# Patient Record
Sex: Female | Born: 1945 | Race: White | Hispanic: No | Marital: Married | Smoking: Former smoker
Health system: Southern US, Academic
[De-identification: ages and names within clinical notes are randomized; demographics above are authoritative.]

---

## 2003-11-24 HISTORY — PX: REPLACEMENT TOTAL KNEE: SUR1224

## 2010-12-24 HISTORY — PX: COLONOSCOPY W/ ENDOSCOPIC FNA/FNB: SHX1378

## 2012-07-20 ENCOUNTER — Ambulatory Visit (HOSPITAL_COMMUNITY): Payer: Self-pay | Admitting: Family Medicine

## 2014-09-13 IMAGING — DX SACRUM/COCCYX
3 series · 3 of 3 positions shown · non-contrast
Comparison: None

SACRUM/COCCYX 09/13/2014 [DATE]:
CLINICAL INDICATION: Coccyx pain x2 months

[AP]
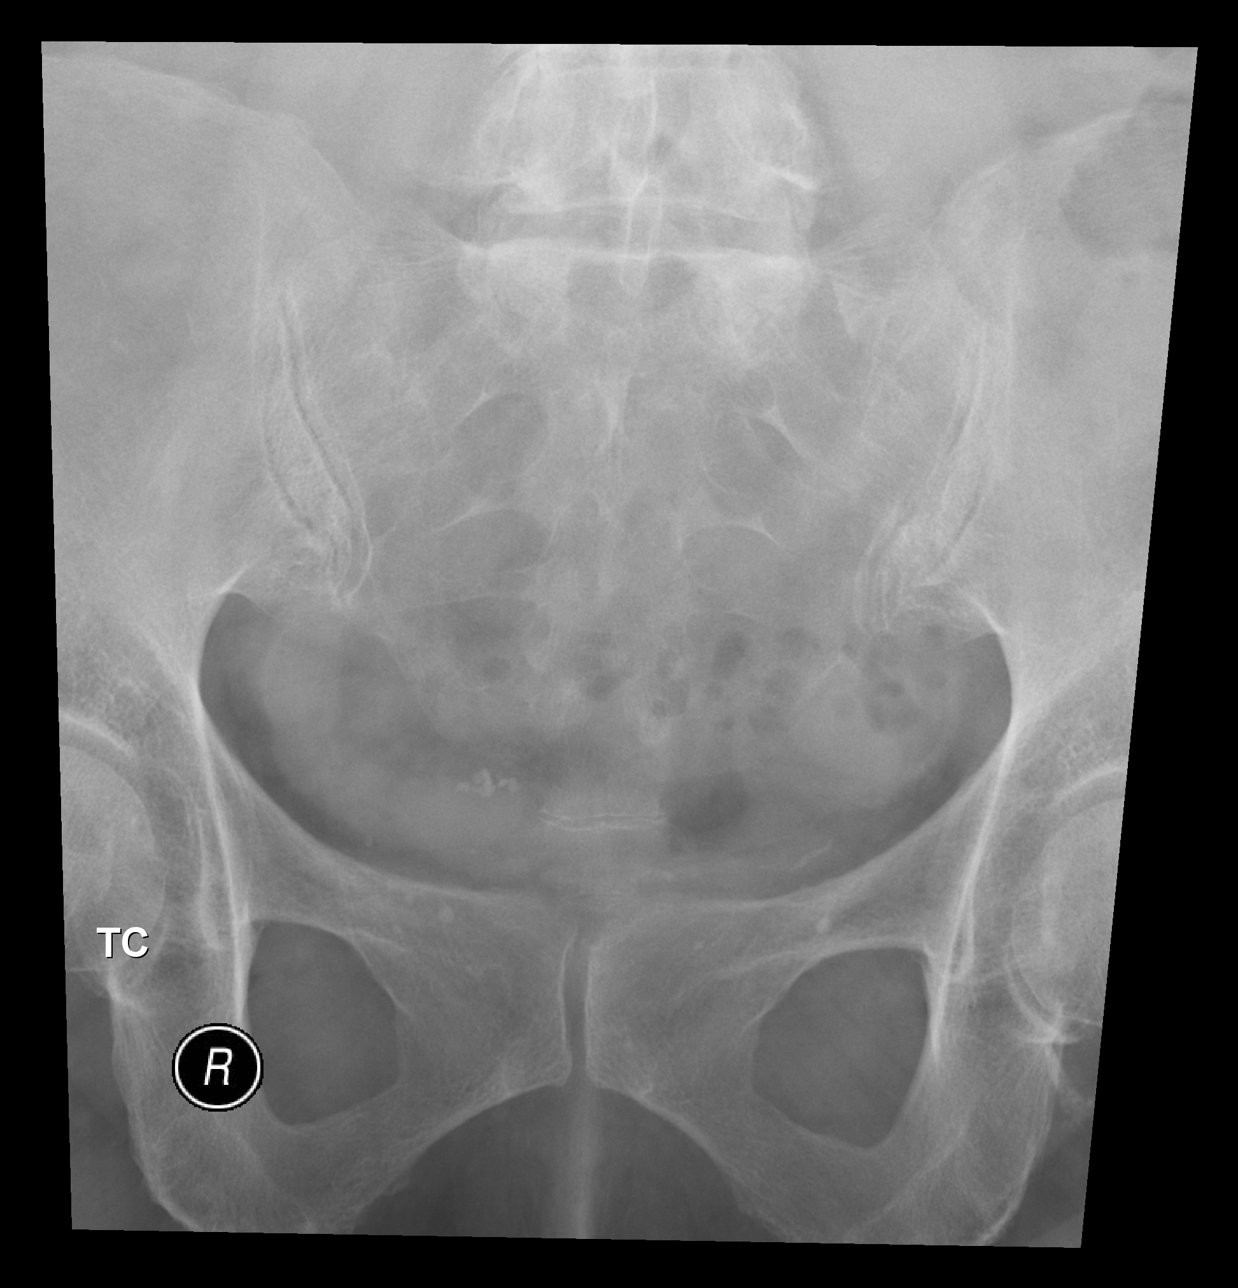

[lateral]
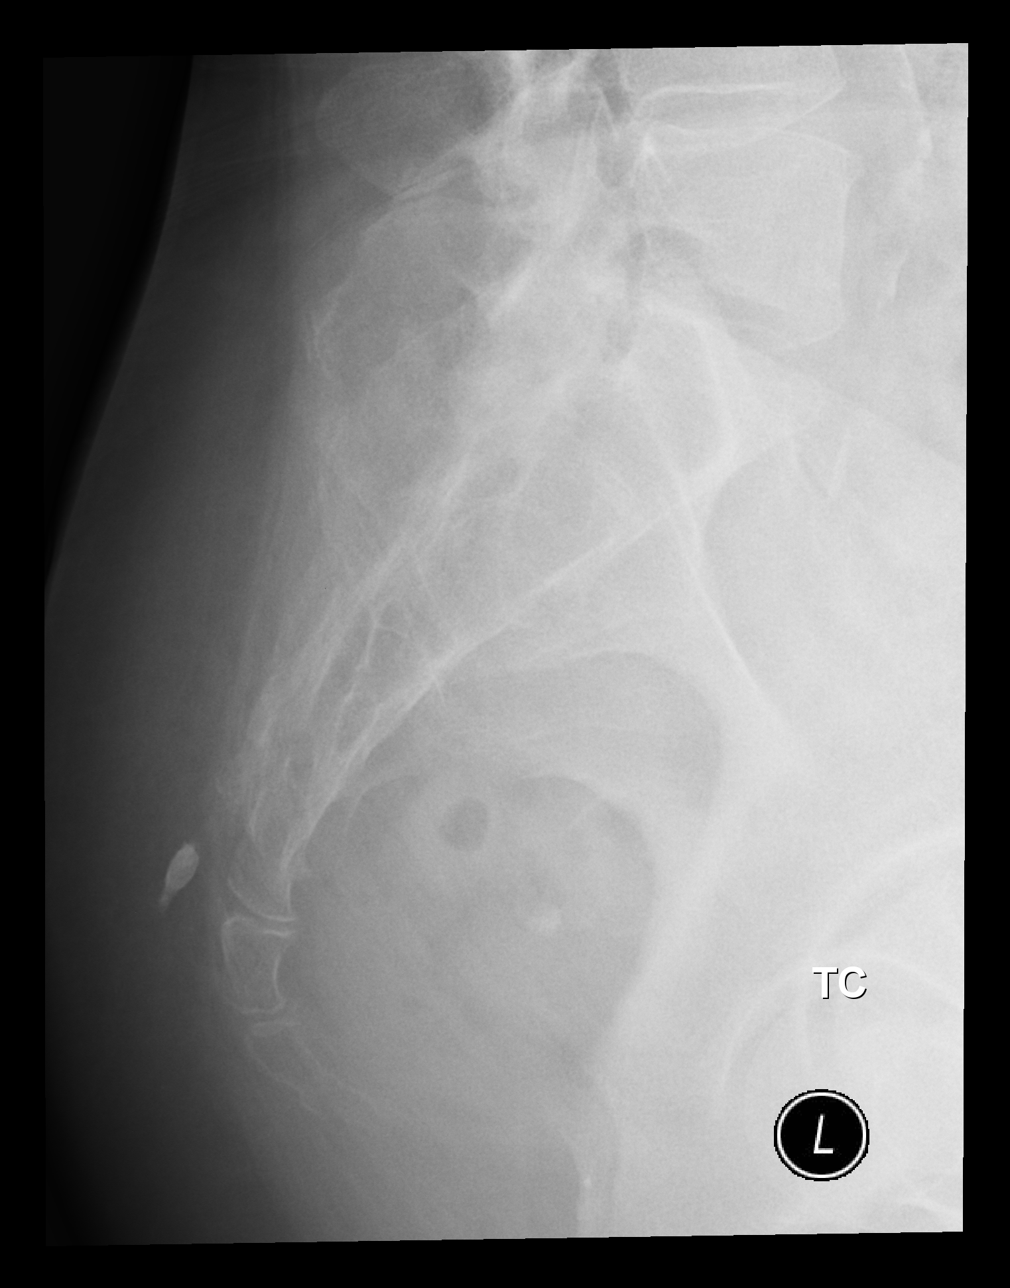

[ap axial]
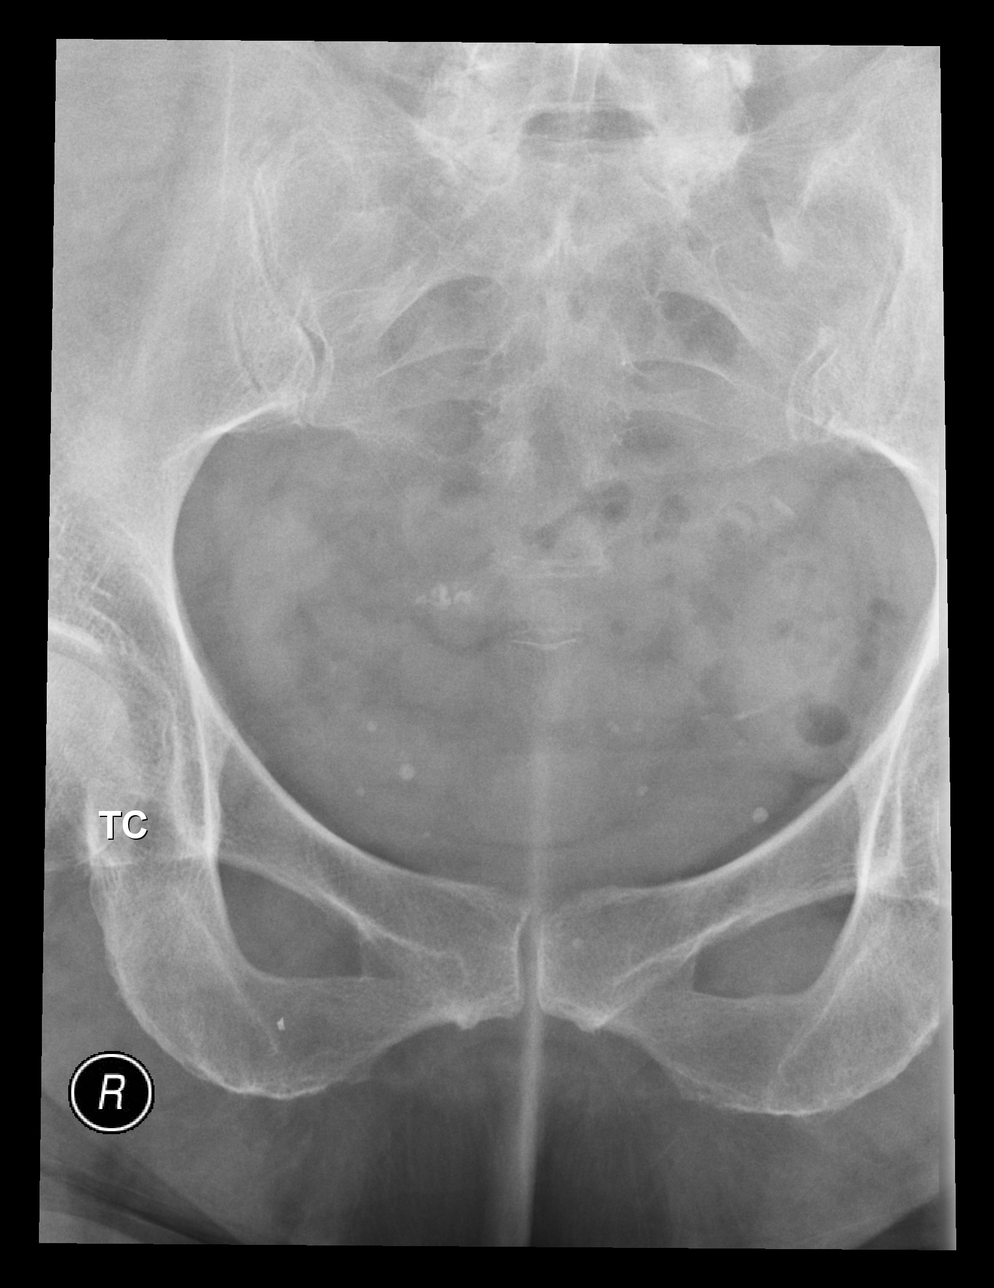

[3 of 3 positions shown; findings below may reference images not displayed]

FINDINGS: There were mild to moderate SI joint degenerative changes. There is
no
bone erosion. Sacrococcygeal alignment appears maintained on the lateral view.
There is no evidence for sacral fracture. There are pelvic phleboliths. There
is
osteoporosis.
IMPRESSION: SI joint degenerative changes. No evidence for sacral or coccyx fracture or
bone
destruction. If symptoms persist MRI would be useful for further evaluation.

## 2015-02-11 ENCOUNTER — Ambulatory Visit (INDEPENDENT_AMBULATORY_CARE_PROVIDER_SITE_OTHER): Payer: Self-pay | Admitting: Gastroenterology

## 2015-06-07 IMAGING — MG MAMMOGRAPHY SCREENING BILATERAL 3D TOMOSYNTHESIS WITH CAD
12 of 16 series · 12 of 32 positions shown · non-contrast
Comparison: 05/01/2014 through 12/28/2008.
BREAST DENSITY: (Level B) There are scattered areas of fibroglandular density.

MAMMOGRAPHY SCREENING 3D TOMOSYNTHESIS WITH CAD, 06/07/2015 [DATE]:
CLINICAL INDICATION: Screening.
TECHNIQUE: Digital mammograms and 3-D Tomosynthesis were obtained. These were
interpreted both primarily and with the aid of computer-aided detection
system.

[L CC synth-2D]
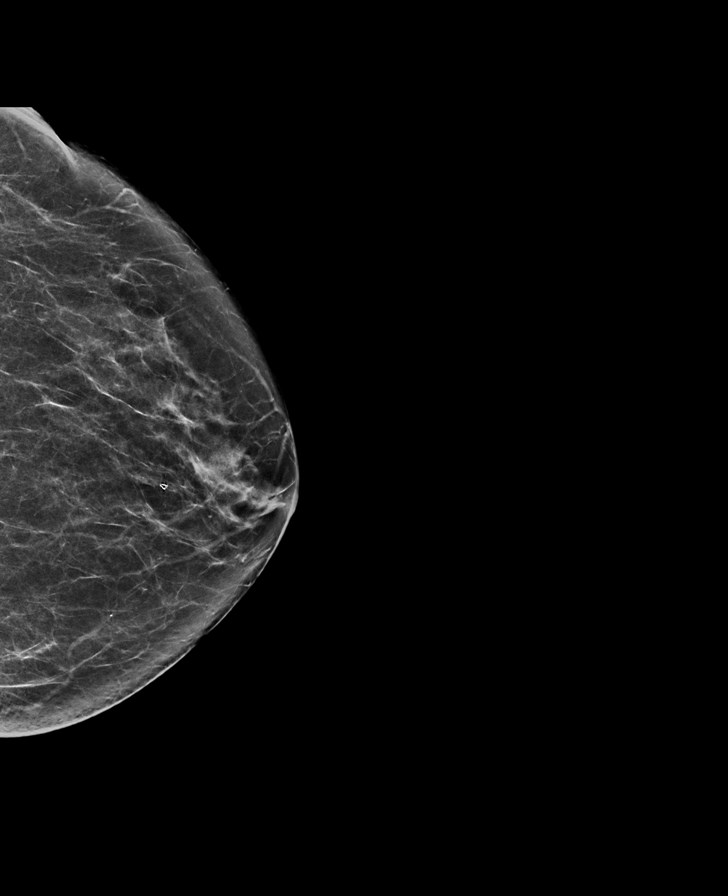

[R CC synth-2D]
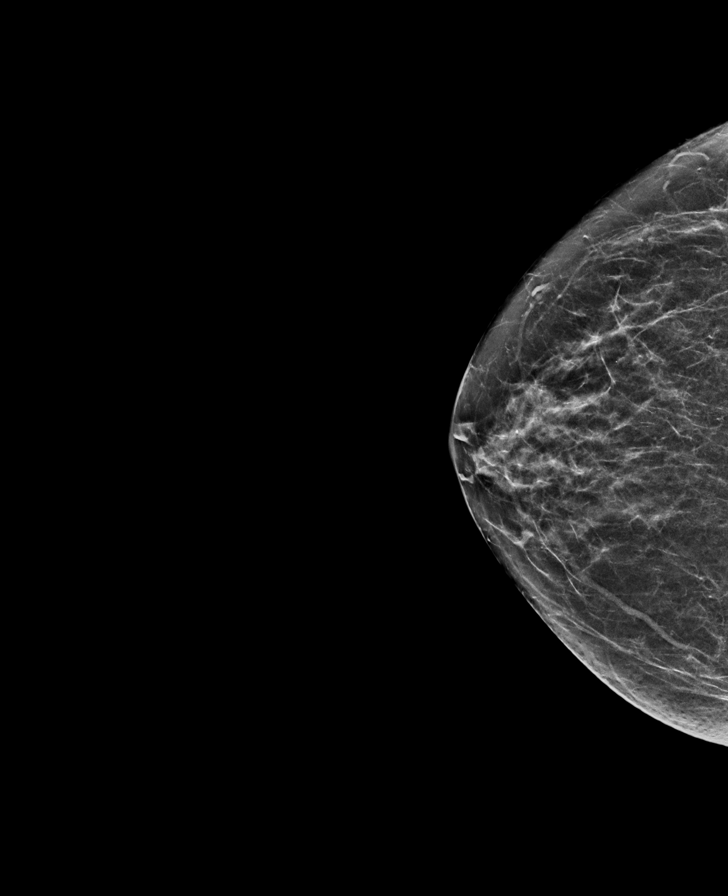

[R MLO synth-2D]
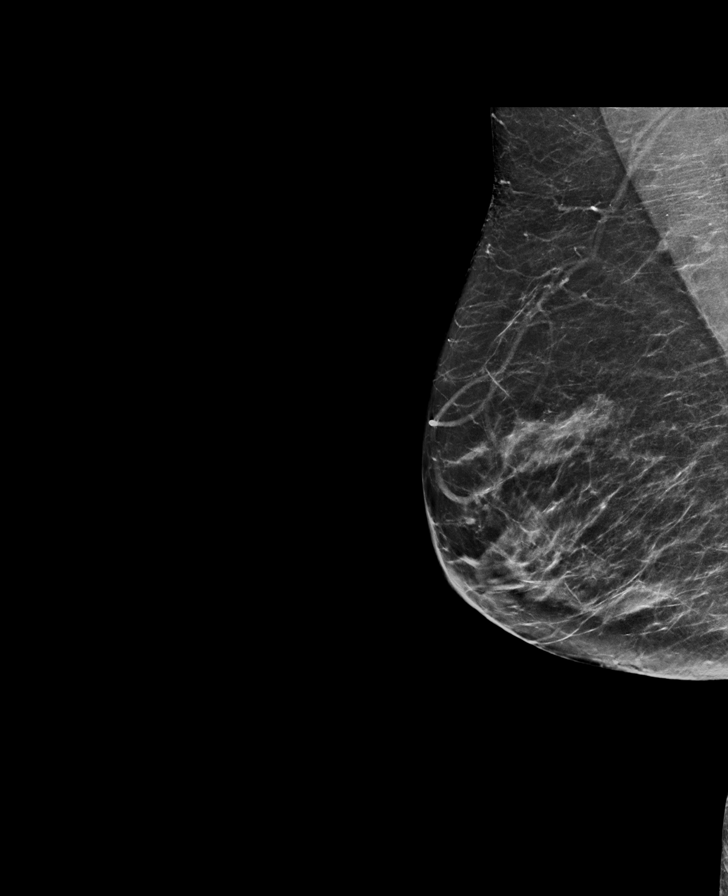

[L MLO synth-2D]
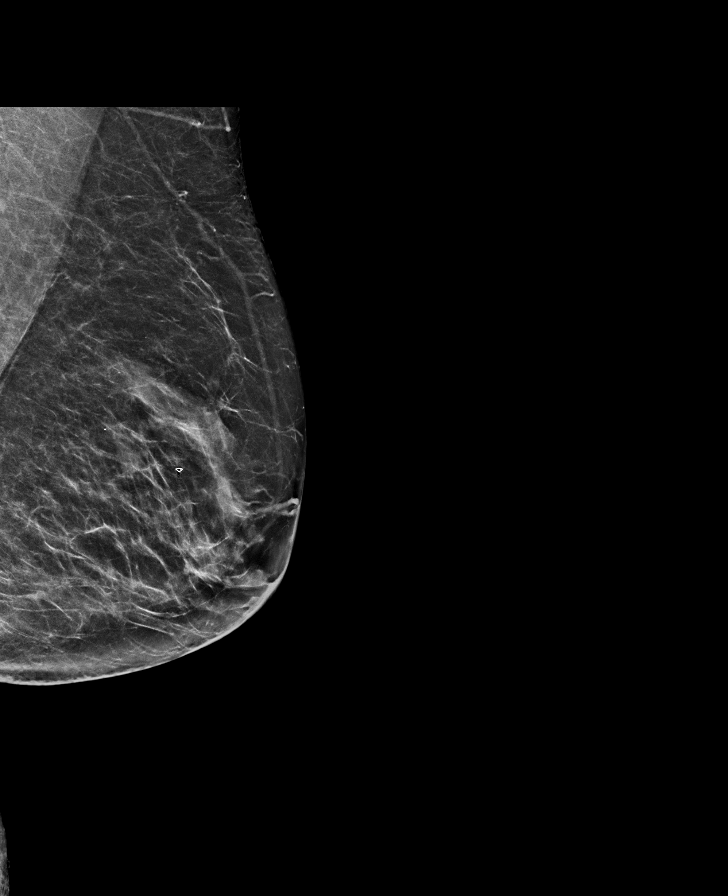

[L CC]
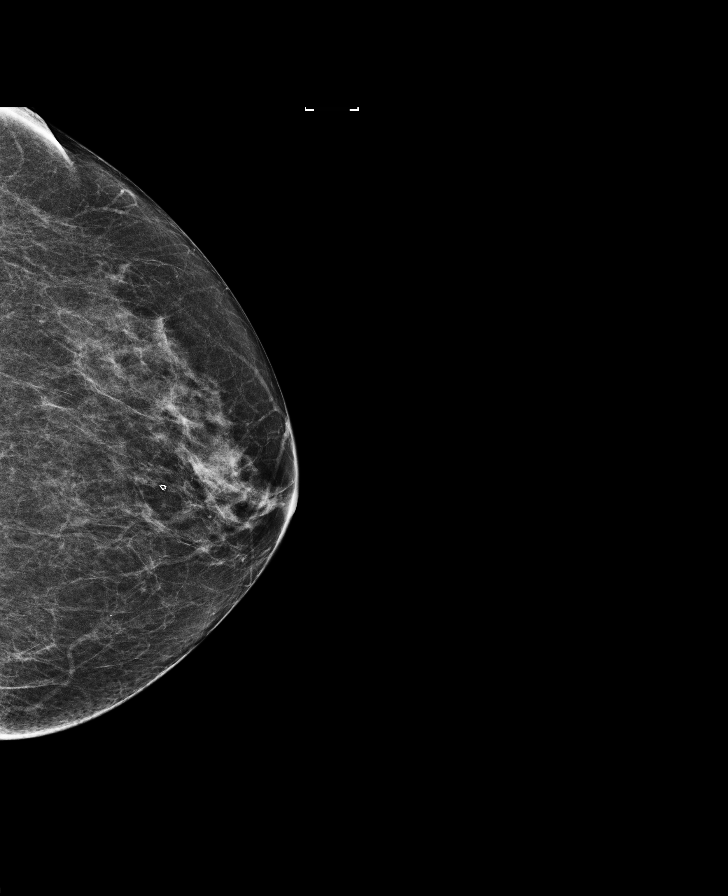

[R CC]
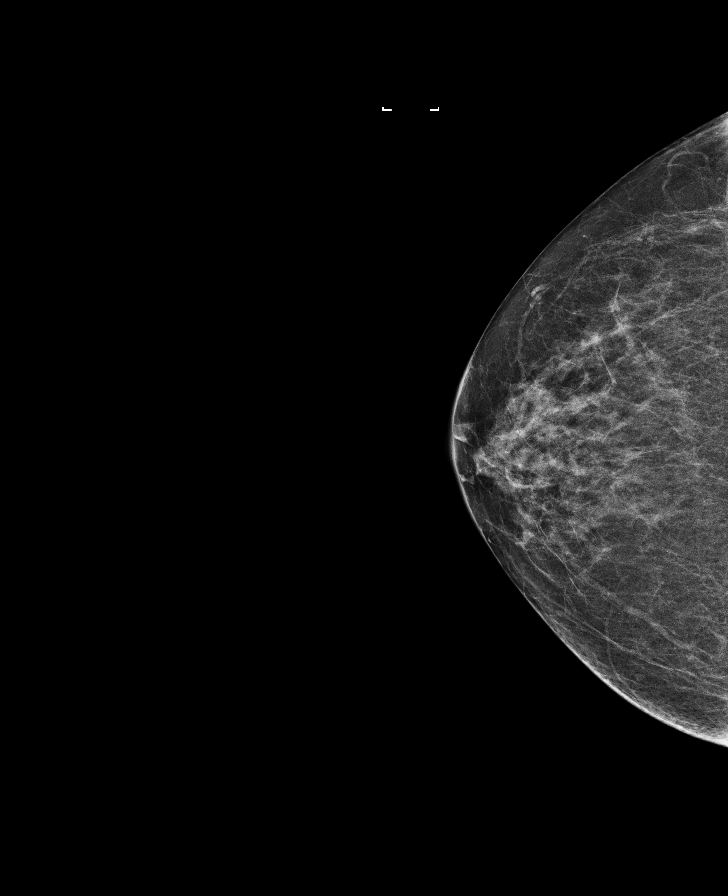

[L MLO]
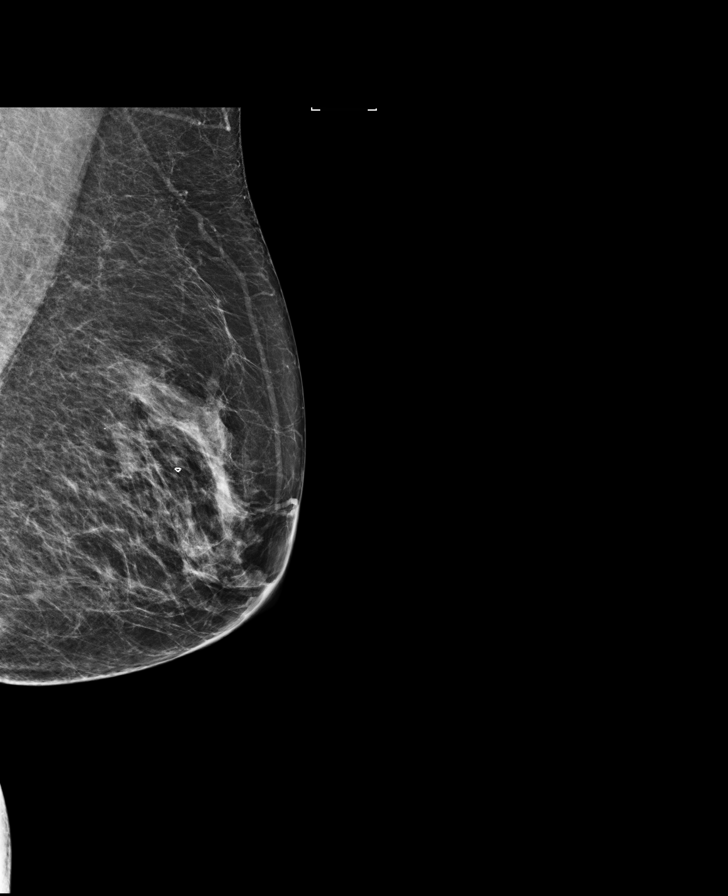

[R MLO]
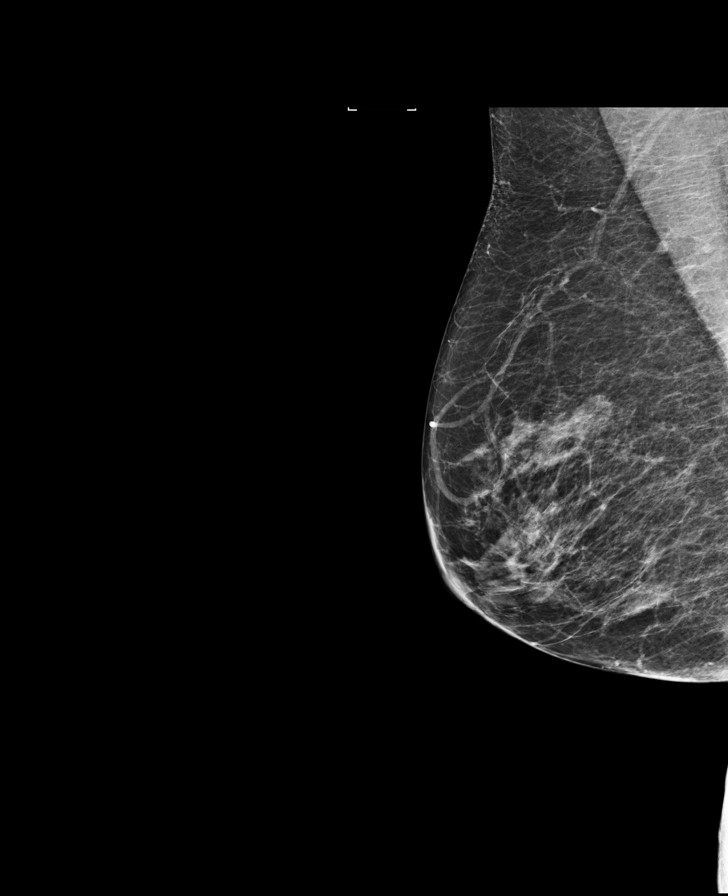

[L CC tomo]
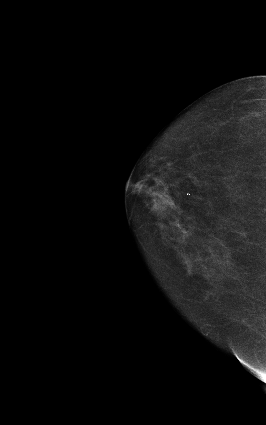

[R CC tomo]
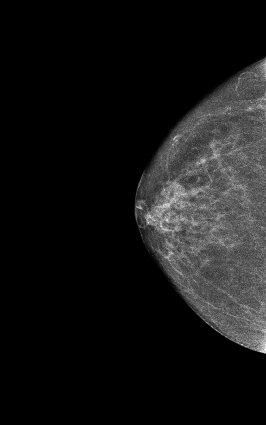

[R MLO tomo]
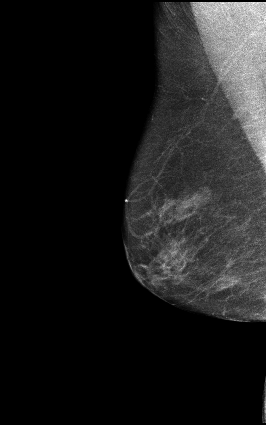

[L MLO tomo]
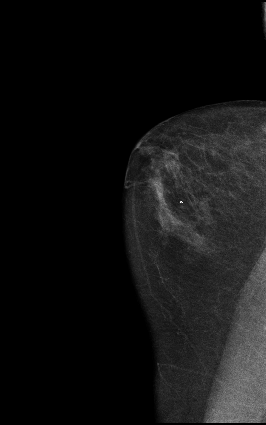

[12 of 32 positions shown; findings below may reference images not displayed]

FINDINGS: No mammographically suspicious abnormality and no significant
change.
IMPRESSION: ( BI-RADS 2) Benign findings. Routine mammographic follow-up is recommended.

## 2015-06-07 IMAGING — CT CT ABDOMEN AND PELVIS WITHOUT CONTRAST
2 of 3 series · 16 of 46 positions shown, 18 images · non-contrast
Comparison: Comparison was made to the prior exam(s) within the last 12
months
dated  06/07/2015 ultrasound exam.

CT ABDOMEN AND PELVIS WITHOUT CONTRAST, 06/07/2015 [DATE]:
CLINICAL INDICATION:  Renal colic. UTI. History of kidney stones. Ended
antibiotics yesterday.
A search for DICOM formatted images was conducted for prior CT imaging studies
completed at a non-affiliated media free facility.
TECHNIQUE: The region of interest was scanned without contrast on a high
resolution CT scanner using dose reduction techniques .  Routine MPR
reconstructions were performed.

[Series 3: abd/pel w/o 3.0 i41s 2 · axial · non-contrast · 0.96mm/px · z∈[-512,-83]mm · 13 of 165 slices shown, 15 images]
[im 11/165  soft-tissue]
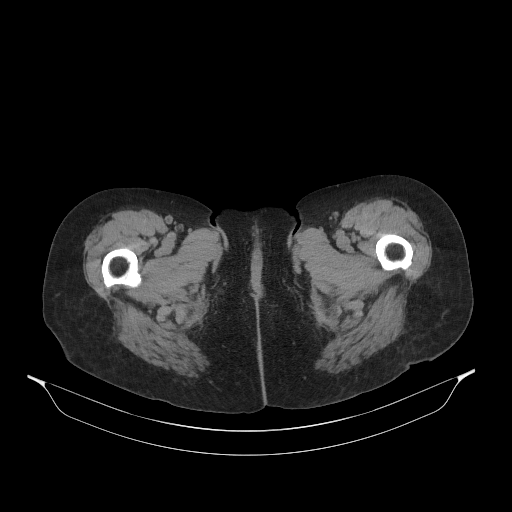
[im 11/165  bone]
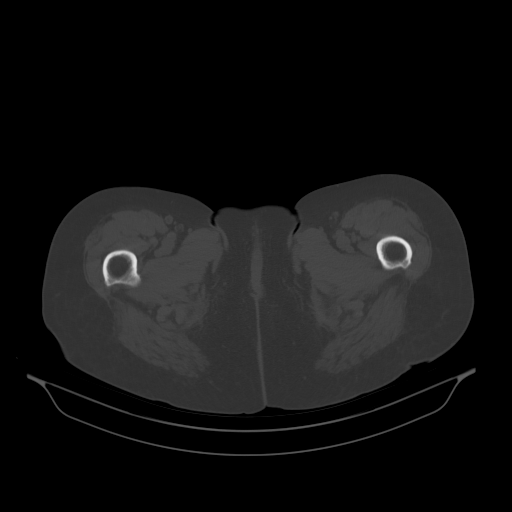
[im 22/165  soft-tissue]
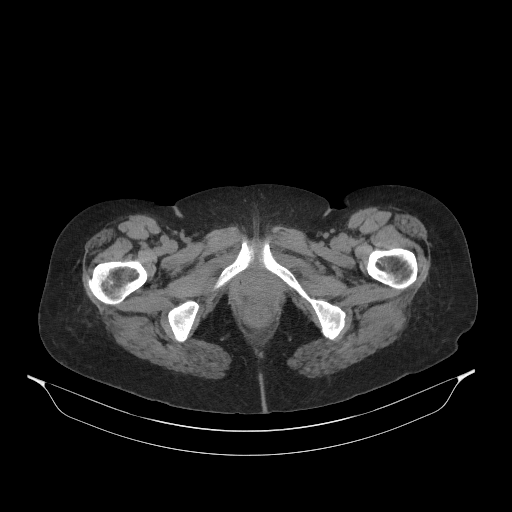
[im 32/165  soft-tissue]
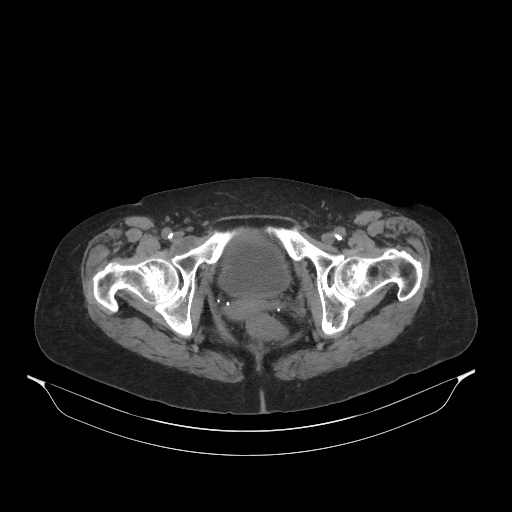
[im 48/165  soft-tissue]
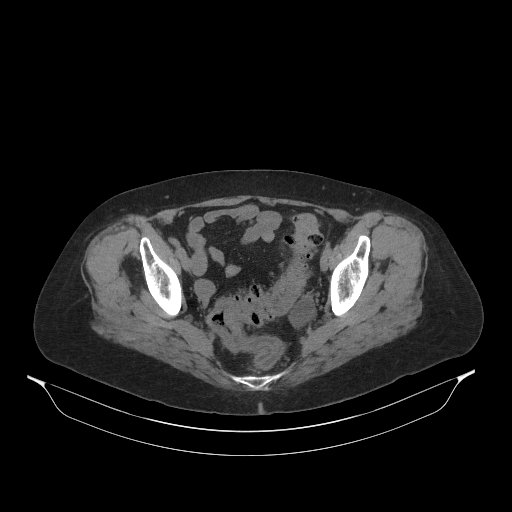
[im 59/165  soft-tissue]
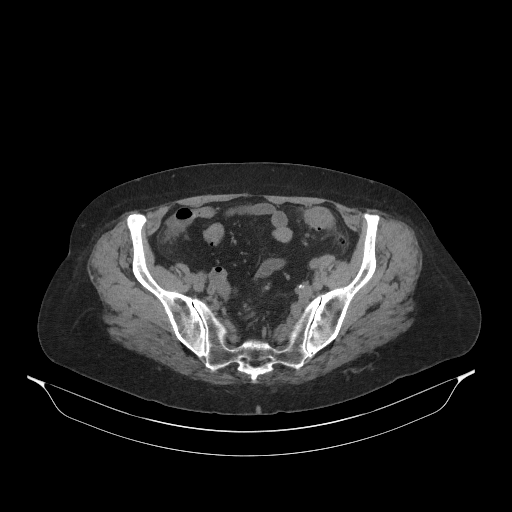
[im 69/165  soft-tissue]
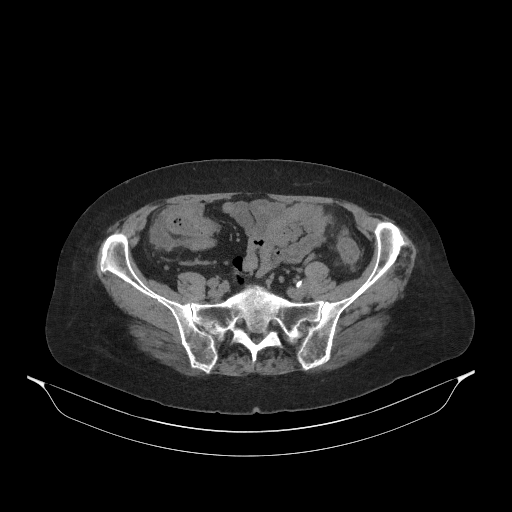
[im 85/165  soft-tissue]
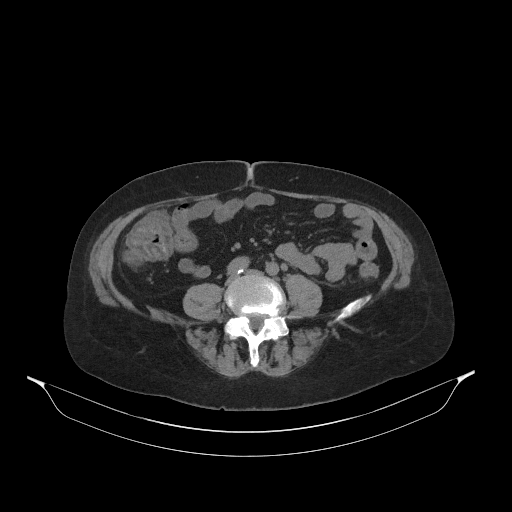
[im 96/165  soft-tissue]
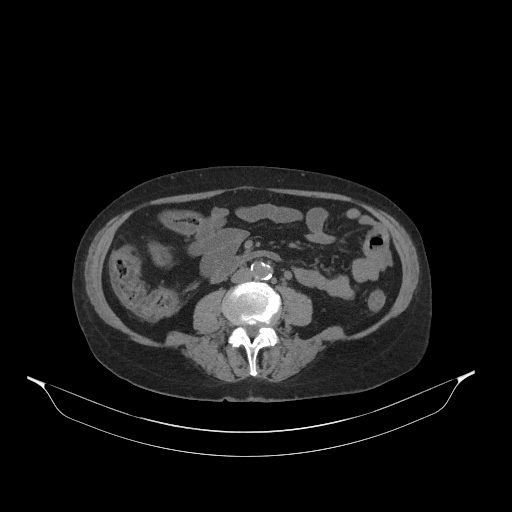
[im 106/165  soft-tissue]
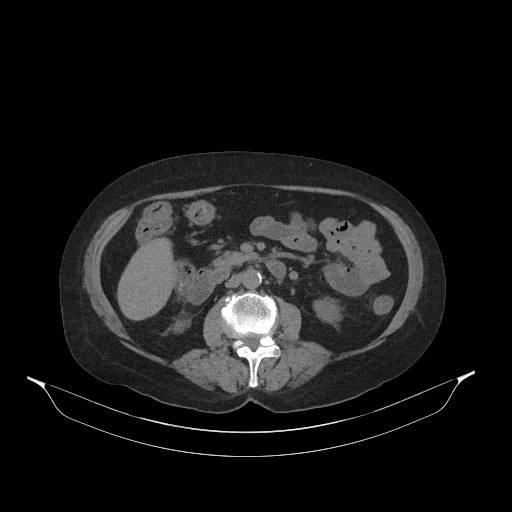
[im 106/165  bone]
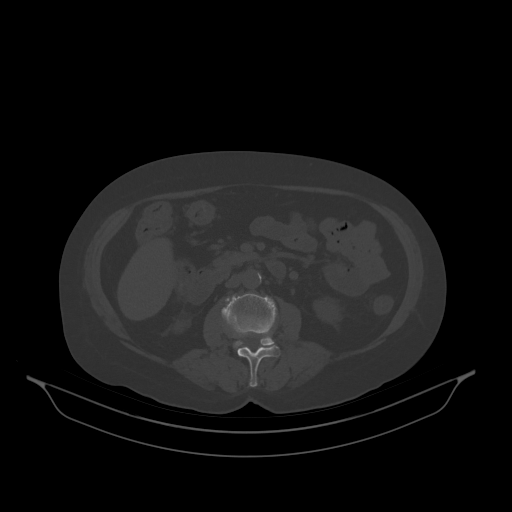
[im 117/165  soft-tissue]
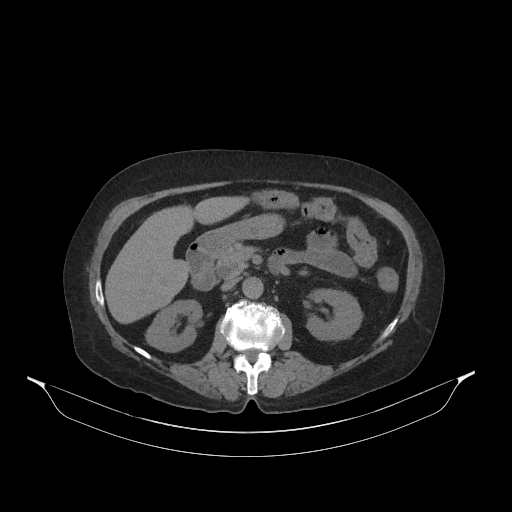
[im 133/165  soft-tissue]
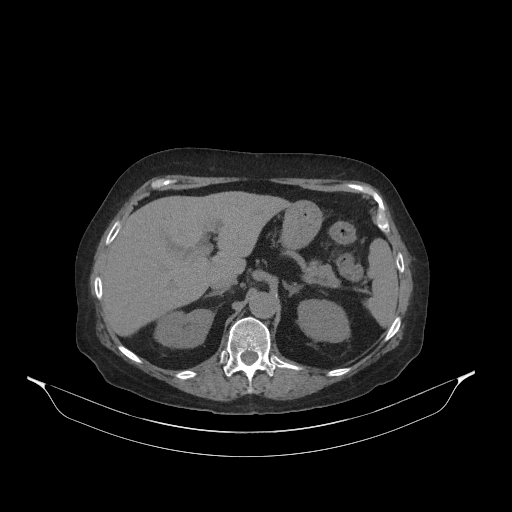
[im 143/165  soft-tissue]
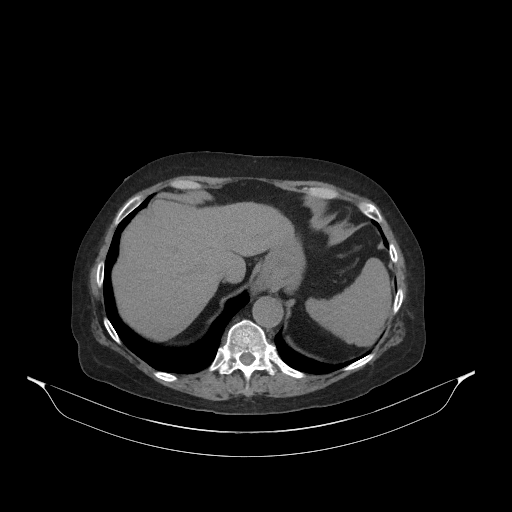
[im 154/165  soft-tissue]
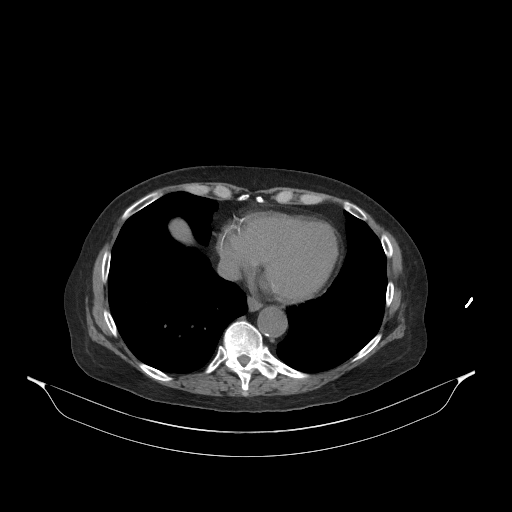

[Series 4: coronal · coronal · 0.76mm/px · 3 of 110 slices shown]
[im 37/110  soft-tissue]
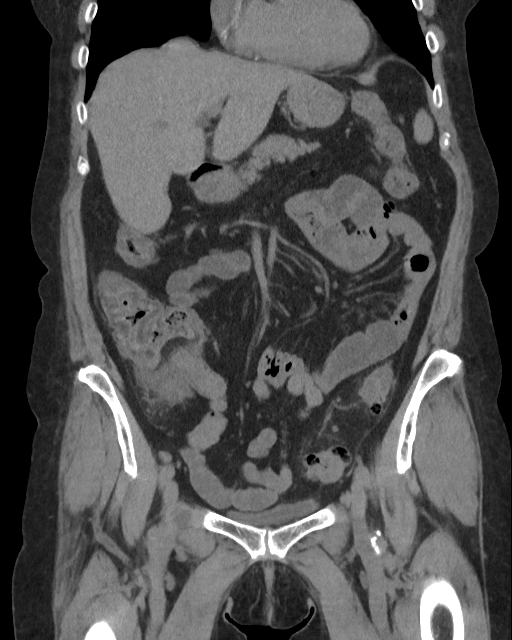
[im 49/110  soft-tissue]
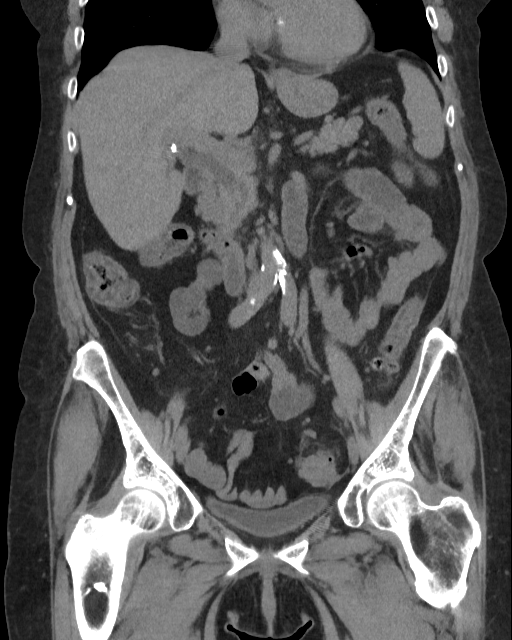
[im 61/110  soft-tissue]
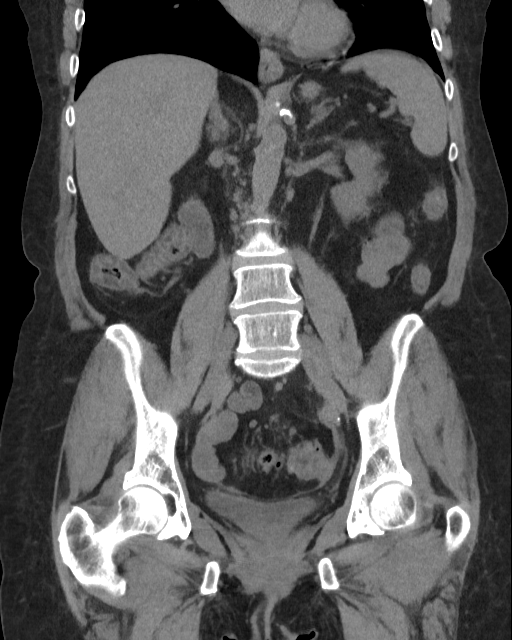

[16 of 46 positions shown; findings below may reference images not displayed]

FINDINGS: CHEST: Limited views of the lower lungs are clear.
ABDOMEN: The liver and spleen are normal in size and attenuation. The
gallbladder is surgically absent. There is compensatory reservoir-type bile
duct
dilatation of the common bile duct measuring 9 mm at the level of the
pancreatic
head. No choledocholithiasis. The pancreas and adrenal glands are negative.
The
kidneys are normal in size. There is a 2 mm stone within the midportion of the
left kidney. No hydronephrosis. There is mild diffuse wall thickening
throughout
the colon.
PELVIS: There are scattered extensive sigmoid diverticula. No discrete
surrounding fluid/inflammatory stranding. There is a 3.3 cm left ovarian cyst.
There are exophytic uterine fibroids. No free fluid. The bladder is
nondistended.
MUSCULOSKELETAL: Spondylotic changes visualized spine. Benign bone island
proximal right femur.
IMPRESSION: 1.  2 mm stone within the left kidney. No hydronephrosis.
2.  Mild diffuse wall thickening throughout the colon. This may be artifact
from
incomplete distention versus early pancolitis such as pseudomembranous
colitis,
particularly in this patient with recent antibiotic usage. Recommend clinical
correlation.
3.  Extensive sigmoid diverticula without discrete acute diverticulitis.

## 2015-08-01 DIAGNOSIS — K219 Gastro-esophageal reflux disease without esophagitis: Secondary | ICD-10-CM

## 2015-08-01 DIAGNOSIS — I519 Heart disease, unspecified: Secondary | ICD-10-CM

## 2015-08-01 DIAGNOSIS — I340 Nonrheumatic mitral (valve) insufficiency: Secondary | ICD-10-CM

## 2015-08-01 DIAGNOSIS — D508 Other iron deficiency anemias: Secondary | ICD-10-CM

## 2015-08-01 DIAGNOSIS — Z23 Encounter for immunization: Secondary | ICD-10-CM

## 2015-08-01 DIAGNOSIS — N8320 Unspecified ovarian cysts: Secondary | ICD-10-CM

## 2015-08-01 DIAGNOSIS — F419 Anxiety disorder, unspecified: Secondary | ICD-10-CM

## 2016-04-28 DIAGNOSIS — R718 Other abnormality of red blood cells: Secondary | ICD-10-CM

## 2016-04-28 DIAGNOSIS — E785 Hyperlipidemia, unspecified: Secondary | ICD-10-CM

## 2016-04-28 DIAGNOSIS — I519 Heart disease, unspecified: Secondary | ICD-10-CM

## 2016-04-28 DIAGNOSIS — D508 Other iron deficiency anemias: Secondary | ICD-10-CM

## 2016-04-28 DIAGNOSIS — E559 Vitamin D deficiency, unspecified: Secondary | ICD-10-CM

## 2016-05-06 DIAGNOSIS — K219 Gastro-esophageal reflux disease without esophagitis: Secondary | ICD-10-CM

## 2016-05-06 DIAGNOSIS — I519 Heart disease, unspecified: Secondary | ICD-10-CM

## 2016-05-06 DIAGNOSIS — Z1211 Encounter for screening for malignant neoplasm of colon: Secondary | ICD-10-CM

## 2016-05-06 DIAGNOSIS — R718 Other abnormality of red blood cells: Secondary | ICD-10-CM

## 2016-05-06 DIAGNOSIS — Z6833 Body mass index (BMI) 33.0-33.9, adult: Secondary | ICD-10-CM

## 2016-05-06 DIAGNOSIS — E559 Vitamin D deficiency, unspecified: Secondary | ICD-10-CM

## 2016-05-06 DIAGNOSIS — E7800 Pure hypercholesterolemia, unspecified: Secondary | ICD-10-CM

## 2016-05-06 DIAGNOSIS — Z124 Encounter for screening for malignant neoplasm of cervix: Secondary | ICD-10-CM

## 2016-09-18 IMAGING — MG MAMMOGRAPHY SCREENING BILATERAL 3D TOMOSYNTHESIS WITH CAD
12 of 16 series · 12 of 32 positions shown · non-contrast
Comparison: 06/07/2015 through 02/04/2010.
BREAST DENSITY: (Level B) There are scattered areas of fibroglandular density.

MAMMOGRAPHY SCREENING BILATERAL 3D TOMOSYNTHESIS WITH CAD, 09/18/2016 [DATE]:
CLINICAL INDICATION: Screening.
TECHNIQUE: Digital bilateral mammograms and 3-D Tomosynthesis were obtained.
These were interpreted both primarily and with the aid of computer-aided
detection system.

[L MLO synth-2D]
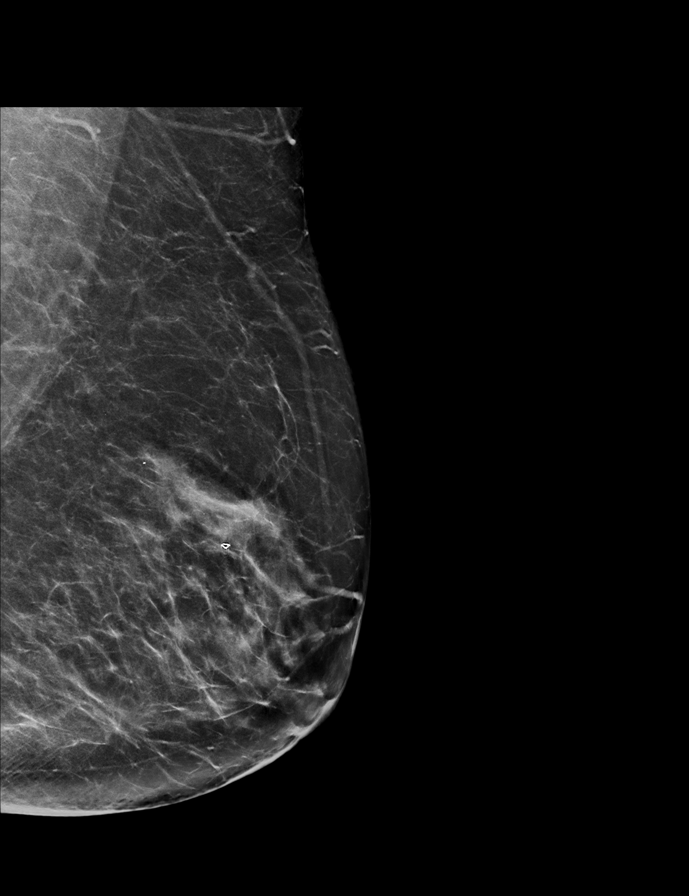

[R CC]
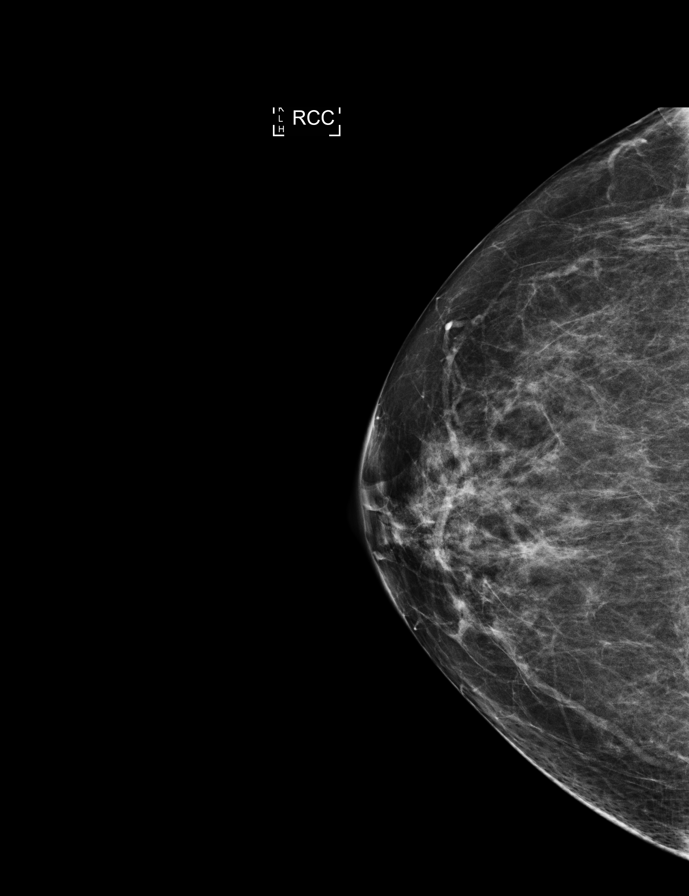

[L MLO]
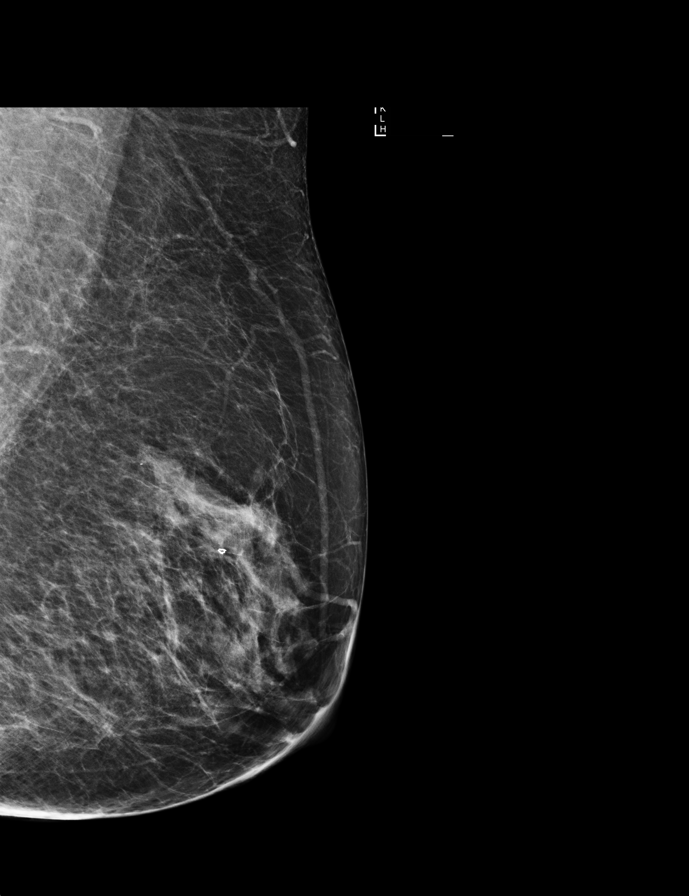

[L CC]
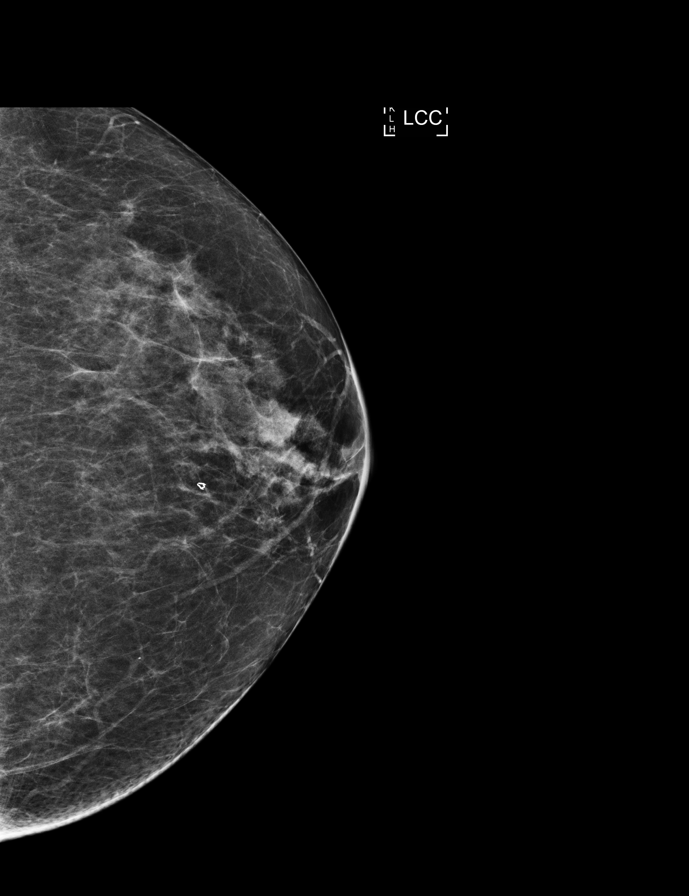

[R MLO synth-2D]
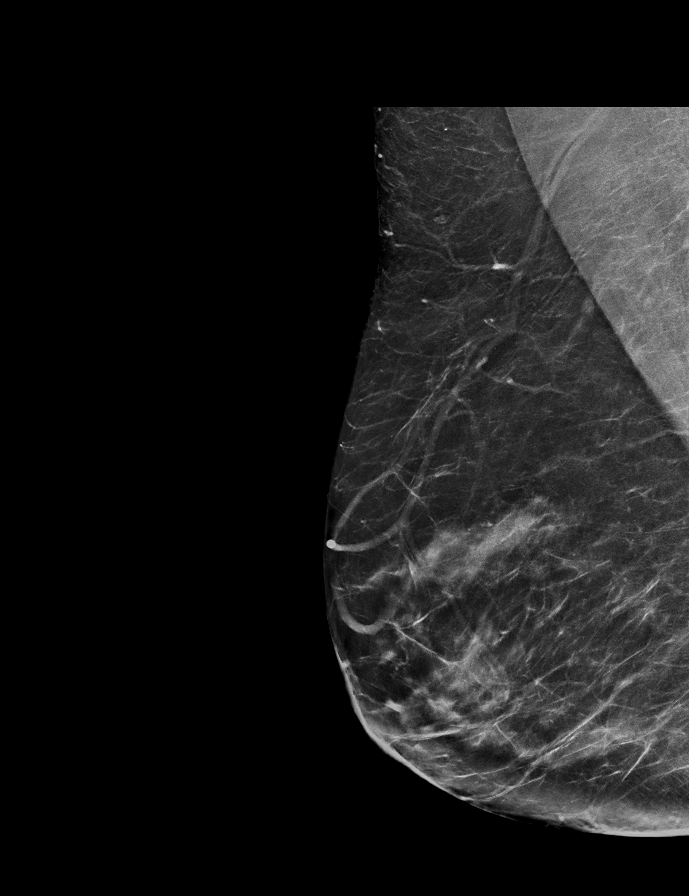

[R MLO]
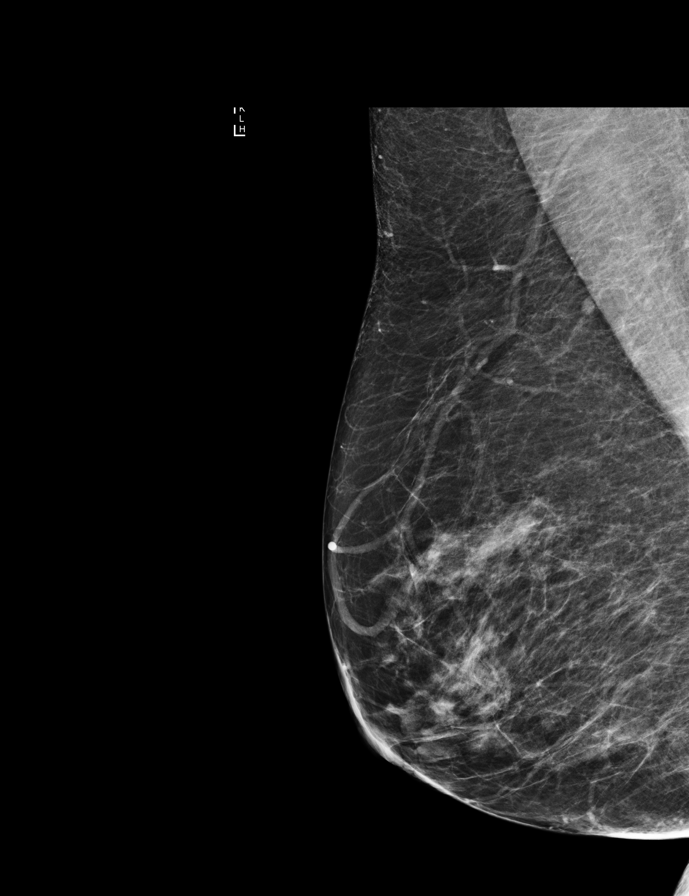

[R CC synth-2D]
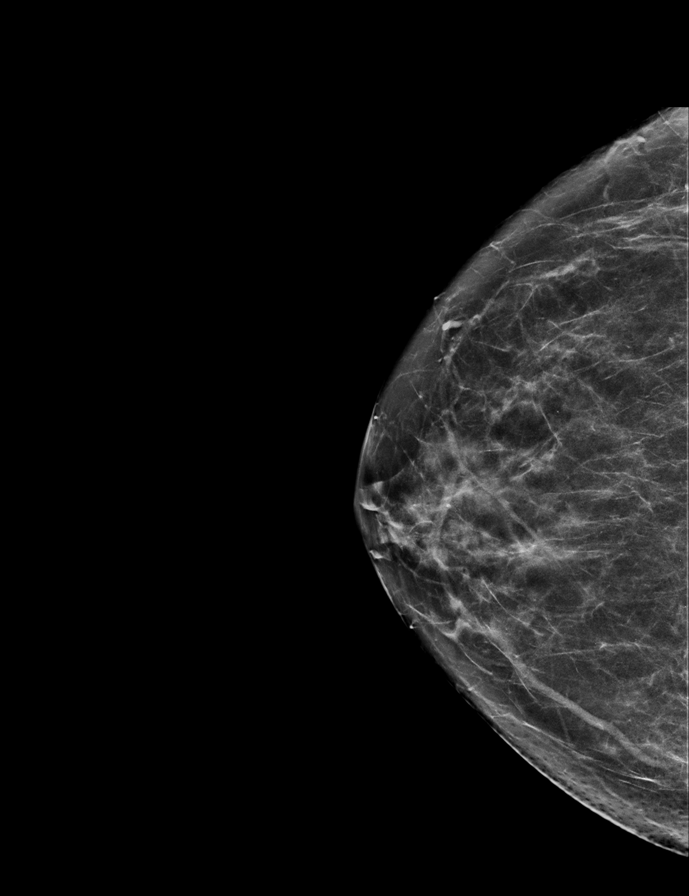

[L CC synth-2D]
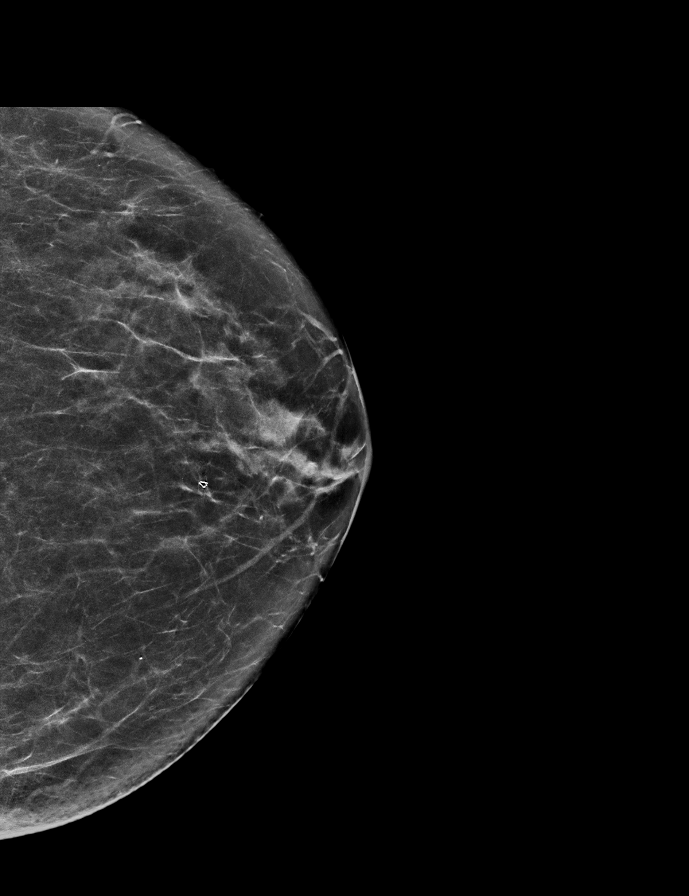

[L CC tomo]
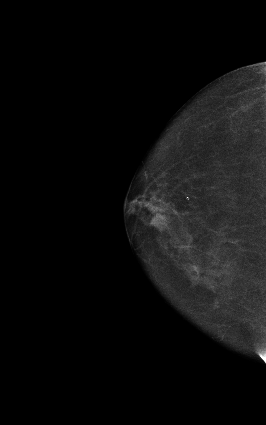

[L MLO tomo]
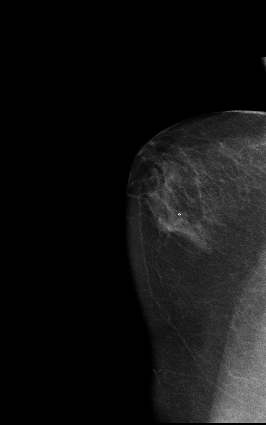

[R CC tomo]
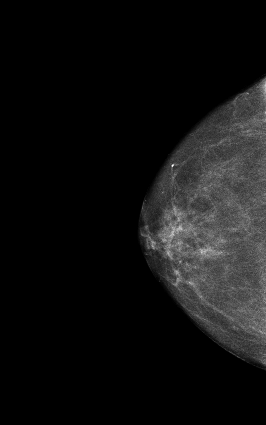

[R MLO tomo]
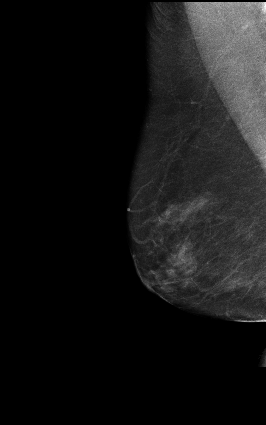

[12 of 32 positions shown; findings below may reference images not displayed]

FINDINGS: No mammographically suspicious abnormality and no significant
change.
IMPRESSION: ( BI-RADS 2) Benign findings. Routine mammographic follow-up is recommended.

## 2017-04-13 ENCOUNTER — Encounter (INDEPENDENT_AMBULATORY_CARE_PROVIDER_SITE_OTHER): Payer: Self-pay | Admitting: Family Medicine

## 2017-04-13 DIAGNOSIS — F411 Generalized anxiety disorder: Secondary | ICD-10-CM | POA: Insufficient documentation

## 2017-04-13 DIAGNOSIS — K219 Gastro-esophageal reflux disease without esophagitis: Secondary | ICD-10-CM

## 2017-04-13 DIAGNOSIS — R718 Other abnormality of red blood cells: Secondary | ICD-10-CM | POA: Insufficient documentation

## 2017-04-13 DIAGNOSIS — D508 Other iron deficiency anemias: Secondary | ICD-10-CM | POA: Insufficient documentation

## 2017-04-13 DIAGNOSIS — R5381 Other malaise: Secondary | ICD-10-CM

## 2017-04-13 DIAGNOSIS — K449 Diaphragmatic hernia without obstruction or gangrene: Secondary | ICD-10-CM | POA: Insufficient documentation

## 2017-04-13 DIAGNOSIS — R5383 Other fatigue: Secondary | ICD-10-CM

## 2017-04-13 DIAGNOSIS — K5730 Diverticulosis of large intestine without perforation or abscess without bleeding: Secondary | ICD-10-CM

## 2017-04-13 DIAGNOSIS — Z9109 Other allergy status, other than to drugs and biological substances: Secondary | ICD-10-CM

## 2017-04-13 DIAGNOSIS — N83202 Unspecified ovarian cyst, left side: Secondary | ICD-10-CM | POA: Insufficient documentation

## 2017-04-13 DIAGNOSIS — I272 Pulmonary hypertension, unspecified: Secondary | ICD-10-CM | POA: Insufficient documentation

## 2017-04-13 DIAGNOSIS — B9689 Other specified bacterial agents as the cause of diseases classified elsewhere: Secondary | ICD-10-CM | POA: Insufficient documentation

## 2017-04-13 DIAGNOSIS — K222 Esophageal obstruction: Secondary | ICD-10-CM | POA: Insufficient documentation

## 2017-04-13 DIAGNOSIS — Z1231 Encounter for screening mammogram for malignant neoplasm of breast: Secondary | ICD-10-CM | POA: Insufficient documentation

## 2017-04-13 DIAGNOSIS — Z8249 Family history of ischemic heart disease and other diseases of the circulatory system: Secondary | ICD-10-CM | POA: Insufficient documentation

## 2017-04-13 DIAGNOSIS — R739 Hyperglycemia, unspecified: Secondary | ICD-10-CM | POA: Insufficient documentation

## 2017-04-13 DIAGNOSIS — I5189 Other ill-defined heart diseases: Secondary | ICD-10-CM | POA: Insufficient documentation

## 2017-04-13 DIAGNOSIS — J329 Chronic sinusitis, unspecified: Secondary | ICD-10-CM

## 2017-04-13 DIAGNOSIS — E782 Mixed hyperlipidemia: Secondary | ICD-10-CM

## 2017-05-04 ENCOUNTER — Ambulatory Visit (INDEPENDENT_AMBULATORY_CARE_PROVIDER_SITE_OTHER): Payer: Medicare Other

## 2017-05-04 ENCOUNTER — Other Ambulatory Visit (INDEPENDENT_AMBULATORY_CARE_PROVIDER_SITE_OTHER): Payer: Self-pay | Admitting: Family Medicine

## 2017-05-04 ENCOUNTER — Ambulatory Visit: Payer: Medicare Other | Attending: Family Medicine | Admitting: Family Medicine

## 2017-05-04 DIAGNOSIS — R5381 Other malaise: Principal | ICD-10-CM

## 2017-05-04 DIAGNOSIS — R5383 Other fatigue: Principal | ICD-10-CM

## 2017-05-04 DIAGNOSIS — I272 Pulmonary hypertension, unspecified: Secondary | ICD-10-CM

## 2017-05-04 DIAGNOSIS — E782 Mixed hyperlipidemia: Secondary | ICD-10-CM | POA: Insufficient documentation

## 2017-05-04 LAB — HEPATIC FUNCTION PANEL
ALBUMIN: 3.8 g/dL (ref 3.2–4.6)
ALKALINE PHOSPHATASE: 64 U/L (ref 20–130)
ALT (SGPT): 19 U/L (ref <=52)
AST (SGOT): 22 U/L (ref <=35)
BILIRUBIN DIRECT: 0.1 mg/dL (ref <=0.3)
BILIRUBIN TOTAL: 0.6 mg/dL (ref 0.3–1.2)
PROTEIN TOTAL: 6.4 g/dL (ref 6.0–8.3)

## 2017-05-04 LAB — BASIC METABOLIC PANEL, FASTING
ANION GAP: 9 mmol/L
BUN/CREA RATIO: 17
BUN: 14 mg/dL (ref 10–25)
CALCIUM: 9.6 mg/dL (ref 8.2–10.2)
CHLORIDE: 108 mmol/L (ref 98–111)
CO2 TOTAL: 25 mmol/L (ref 21–35)
CO2 TOTAL: 25 mmol/L (ref 21–35)
CREATININE: 0.81 mg/dL (ref <=1.30)
ESTIMATED GFR: >60 mL/min/1.73mˆ2
GLUCOSE: 94 mg/dL (ref 70–110)
POTASSIUM: 4.6 mmol/L (ref 3.5–5.0)
SODIUM: 142 mmol/L (ref 135–145)

## 2017-05-04 LAB — THYROID STIMULATING HORMONE (SENSITIVE TSH): TSH: 2.719 u[IU]/mL (ref 0.450–5.330)

## 2017-05-04 LAB — CBC WITH DIFF
BASOPHIL #: 0.1 x10ˆ3/uL (ref 0.00–0.20)
BASOPHIL %: 2 %
EOSINOPHIL #: 0.3 x10ˆ3/uL (ref 0.00–0.50)
EOSINOPHIL %: 5 %
HCT: 42.1 % (ref 34.6–46.2)
HGB: 14 g/dL (ref 11.8–15.8)
LYMPHOCYTE #: 1.6 x10ˆ3/uL (ref 0.90–3.40)
LYMPHOCYTE %: 29 %
MCH: 30.2 pg (ref 27.6–33.2)
MCHC: 33.2 g/dL (ref 32.6–35.4)
MCV: 90.8 fL (ref 82.3–96.7)
MONOCYTE #: 0.5 x10ˆ3/uL (ref 0.20–0.90)
MONOCYTE %: 9 %
MPV: 8.6 fL (ref 6.6–10.2)
NEUTROPHIL #: 3 x10ˆ3/uL (ref 1.50–6.40)
NEUTROPHIL %: 55 %
PLATELETS: 335 x10ˆ3/uL (ref 140–440)
RBC: 4.64 x10ˆ6/uL (ref 3.80–5.24)
RDW: 13.9 % (ref 12.4–15.2)
WBC: 5.6 x10ˆ3/uL (ref 3.5–10.3)

## 2017-05-04 LAB — THYROXINE, FREE (FREE T4): THYROXINE (T4), FREE: 1.01 ng/dL (ref 0.61–1.12)

## 2017-05-07 LAB — LIPID PANEL
CHOLESTEROL: 172 mg/dL (ref 0–199)
HDL CHOL: 45 mg/dL (ref >40)
LDL DIRECT: 92 mg/dL (ref 0–99)
TRIGLYCERIDES: 100 mg/dL (ref 0–199)
VLDL CALC: 20 mg/dL (ref 0–50)

## 2017-05-12 ENCOUNTER — Ambulatory Visit
Admission: RE | Admit: 2017-05-12 | Discharge: 2017-05-12 | Disposition: A | Discharge status: Home / Self Care | Payer: Medicare Other | Source: Ambulatory Visit | Attending: Family Medicine | Admitting: Family Medicine

## 2017-05-12 ENCOUNTER — Other Ambulatory Visit (INDEPENDENT_AMBULATORY_CARE_PROVIDER_SITE_OTHER): Payer: Self-pay

## 2017-05-12 ENCOUNTER — Ambulatory Visit (INDEPENDENT_AMBULATORY_CARE_PROVIDER_SITE_OTHER): Payer: Medicare Other | Admitting: Family Medicine

## 2017-05-12 ENCOUNTER — Encounter (INDEPENDENT_AMBULATORY_CARE_PROVIDER_SITE_OTHER): Payer: Self-pay | Admitting: Family Medicine

## 2017-05-12 ENCOUNTER — Other Ambulatory Visit (INDEPENDENT_AMBULATORY_CARE_PROVIDER_SITE_OTHER): Payer: Self-pay | Admitting: Family Medicine

## 2017-05-12 ENCOUNTER — Other Ambulatory Visit (HOSPITAL_COMMUNITY): Payer: Medicare Other

## 2017-05-12 ENCOUNTER — Other Ambulatory Visit (HOSPITAL_COMMUNITY): Payer: Self-pay | Admitting: Family Medicine

## 2017-05-12 VITALS — BP 118/62 | HR 62 | Ht 66.0 in | Wt 161.4 lb

## 2017-05-12 DIAGNOSIS — Z6826 Body mass index (BMI) 26.0-26.9, adult: Secondary | ICD-10-CM

## 2017-05-12 DIAGNOSIS — I272 Pulmonary hypertension, unspecified: Secondary | ICD-10-CM

## 2017-05-12 DIAGNOSIS — M810 Age-related osteoporosis without current pathological fracture: Secondary | ICD-10-CM | POA: Insufficient documentation

## 2017-05-12 DIAGNOSIS — Z0000 Encounter for general adult medical examination without abnormal findings: Secondary | ICD-10-CM

## 2017-05-12 DIAGNOSIS — E782 Mixed hyperlipidemia: Secondary | ICD-10-CM

## 2017-05-12 DIAGNOSIS — Z1151 Encounter for screening for human papillomavirus (HPV): Secondary | ICD-10-CM | POA: Insufficient documentation

## 2017-05-12 DIAGNOSIS — Z124 Encounter for screening for malignant neoplasm of cervix: Secondary | ICD-10-CM | POA: Insufficient documentation

## 2017-05-12 DIAGNOSIS — M8588 Other specified disorders of bone density and structure, other site: Secondary | ICD-10-CM | POA: Insufficient documentation

## 2017-05-12 DIAGNOSIS — K219 Gastro-esophageal reflux disease without esophagitis: Principal | ICD-10-CM

## 2017-05-12 DIAGNOSIS — Z9109 Other allergy status, other than to drugs and biological substances: Secondary | ICD-10-CM

## 2017-05-12 NOTE — Patient Instructions (Signed)
..well  Medicare Preventive Services  Medicare coverage information Recommendation for YOU   Heart Disease and Diabetes   Lipid profile every 5 years Lab Results   Component Value Date    CHOLESTEROL 172 05/04/2017    HDLCHOL 45 05/04/2017    TRIG 100 05/04/2017        Diabetes Screening  yearly for those at risk for diabetes, 2 tests per year for those with prediabetes No results found for: GLUCOSEFAST    Diabetes Self Management Training or Medical Nutrition Therapy  for those with diabetes, up to 10 hrs initial training within a year, subsequent years up to 2 hrs of follow up training   Not applicable    Tobacco Cessation (Quitting) Counseling   two attempts per year, max 4 sessions per attempt, up to 8 per year, for those with tobacco-related health condition  Not applicable    Cancer Screening   Colorectal screening   for anyone age 93 or older, or any age if high risk:  . Screening Colonoscopy every 10 yrs low risk,  every 24 months if high risk  OR  . Flex Sig  every 4 yrs, or every 10 yrs after colonoscopy OR  . Fecal Occult Blood Testing yearly OR  . Barium Enema every 4 yrs low risk, every 24 months if high risk   2015   Screening Pap Test and Pelvic Exam   every 2 years, yearly if  high risk or childbearing age with abnormal Pap in last 3 yrs.  Recommended up to age 85 if previous screening had been normal.  done   Screening Mammogram   yearly for women age 56 or older done   Prostate Cancer Screening   yearly for men 46 or older  . Digital Rectal Exam   . Prostate Specific Antigen na   Vaccinations   Pneumococcal Vaccine   once in lifetime, repeat in 5 years if high risk  Seasonal Influenza Vaccine  once per flu season  Hepatitis B Vaccine   3 doses if medium or high risk (insluding anyone with diabetes or liver disease)  Zostavax (shingles) Vaccine   once if age 88 or older with Medicare Part D --available at pharmacies without a prescription  Diptheria Tetanus Pertussis Vaccine  Diptheria Tetanus  Vaccine  Tetanus Toxoid Vaccine  Covered under Medicare Part D --available at pharmacies without a prescription     Immunization History   Administered Date(s) Administered   . Pneumovax 08/08/2013   . Prevnar 13 (Admin) 11/01/2014   . ZOSTAVAX (VARICELLA ZOSTER VACCINE (ADMIN) 12/08/2012         Zostavax Shingles vaccine is recommended once at age 49 and can be obtained at the pharmacy, free with Medicare Part D.  No prescription required.    Tetanus diptheria BOOSTER recommended EVERY 10 YEARS --given at pharmacy   Other Screening   Bone Densitometry   every 24 months for anyone at risk   due  Recommended every 2 years   Glaucoma Screening   yearly if in high risk group such as diabetes, family history, African American age 3+ or Hispanic American age 28+   Annual eye exam recommended   HIV Testing   yearly or up to 3 times in pregnancy  One time test is recommended for everyone   Abdominal Aortic Aneurysm Screening Ultrasound   once with Initial Welcome to Medicare Physical within 12 months of coverage if 65+AND female who smoked 100 cigs/lifetime OR   if 65 +  with family history of AAA   Not recommended     Health Maintenance: Pending and Last Completed       Date Due Completion Date    Depression Screening 12/11/1957 ---    Hepatitis C screening antibody 12/11/1990 ---    Osteoporosis screening 12/11/2010 ---    Shingles Vaccine (2 of 3) 02/02/2013 12/08/2012    Annual Wellness Exam 04/13/2017 ---    Mammography 06/09/2017 06/10/2015    Influenza Vaccine (Season Ended) 07/24/2017 ---    Colonoscopy 09/11/2024 09/11/2014

## 2017-05-12 NOTE — Progress Notes (Addendum)
[facility] Amanda Zhang, Amanda Zhang  [address]  [place] [address]    Medicare Annual Wellness Visit    Name: Amanda Zhang MRN:  [id number]   Date: 05/12/2017 Age: 71 y.o.       SUBJECTIVE:   Amanda Zhang is a 71 y.o. female for presenting for Medicare Wellness exam.   I have reviewed and reconciled the medication list with the patient today.    I have reviewed and updated as appropriate the past medical, family and social history. 05/12/2017 as summarized below:  No past medical history on file.  Past Surgical History:   Procedure Laterality Date   . Colonoscopy w/ endoscopic fna/fnb  12/24/2010   . Replacement total knee  2005     Current Outpatient Prescriptions   Medication Sig   . alendronate (FOSAMAX) 70 mg Oral Tablet Take 1 Tab (70 mg total) by mouth Every 7 days 1st am,empty stomach,  full glass of water, nothing by mouth & no reclining for 30 min     Family Medical History     Problem Relation (Age of Onset)    Aortic Aneurysm Mother    Parkinsons Disease Father            Social History     Social History   . Marital status: Married     Spouse name: N/A   . Number of children: N/A   . Years of education: N/A     Social History Main Topics   . Smoking status: Former Games developer   . Smokeless tobacco: Never Used      Comment: quit 1990   . Alcohol use Yes      Comment: rarely   . Drug use: No   . Sexual activity: No     Other Topics Concern   . Not on file     Social History Narrative     Review of Systems: A comprehensive review of systems was negative.     List of Current Health Care Providers   Care Team     PCP     Name Type Specialty Phone Number    Lucy Antigua, MD Physician [facility] [phone]          Care Team     No care team found                  Health Maintenance   Topic Date Due   . Hepatitis C screening antibody  12/11/1990   . Shingles Vaccine (2 of 3) 02/02/2013   . Mammography  06/09/2017   . Influenza Vaccine (1) 07/24/2017   . Depression Screening  05/12/2018   .  Annual Wellness Exam  05/12/2018   . Colonoscopy  09/11/2024   . Pneumococcal 65+ Years Low Risk  Completed   . Osteoporosis screening  Completed   . Adult Tdap-Td  Excluded     Medicare Wellness Assessment   Medicare initial or wellness physical in the last year?: No  Advance Directives (optional)   Does patient have a living will or MPOA: YES   Has patient provided [employer] with a copy?: no   Advance directive information given to the patient today?: no      Activities of Daily Living   Do you need help with dressing, bathing, or walking?: No   Do you need help with shopping, housekeeping, medications, or finances?: No   Do you have rugs in hallways, broken steps, or poor lighting?:  No   Do you have grab bars in your bathroom, non-slip strips in your tub, and hand rails on your stairs?: No   Urinary Incontinence Screen (Women >=65 only)   Do you ever leak urine when you don't want to?: No   Cognitive Function Screen (1=Yes, 0=No)   What is you age?: Correct       What is the year?: Correct   What is the name of this clinic?: Correct   Can the patient recognize two persons (the doctor, the nurse, home help, etc.)?: Correct   What is the date of your birth? (day and month sufficient) : Correct   In what year did World War II end?: Correct   Who is the current president of the [place]?: Correct   Count from 20 down to 1?: Correct   What address did I give you earlier?: Correct       Hearing Screen   Have you noticed any hearing difficulties?: No  After whispering 9-1-6 how many numbers did the patient repeat correctly?: 3  After whispering 4-7-8 how many numbers did the patient repeat correctly?: 3   Fall Risk Screen   Do you feel unsteady when standing or walking?: No  Do you worry about falling?: No  Have you fallen in the past year?: No   Vision Screen   Right Eye = 20: 20 (20)   Left Eye = 20: 20   Depression Screen   Little interest or pleasure in doing things.: Not at all  Feeling down, depressed,  or hopeless: Not at all  PHQ 2 Total: 0        OBJECTIVE:   BP 118/62  Pulse 62  Ht 1.676 m (5\' 6" )  Wt 73.2 kg (161 lb 6.4 oz)  LMP 11/23/1993  SpO2 98%  BMI 26.05 kg/m2   Other appropriate exam:    Health Maintenance Due   Topic Date Due   . Hepatitis C screening antibody  12/11/1990   . Shingles Vaccine (2 of 3) 02/02/2013   . Mammography  06/09/2017   . Influenza Vaccine (1) 07/24/2017      ASSESSMENT & PLAN:    Identified Risk Factors/ Recommended Actions   BMI addressed: Advised on diet, weight loss, and exercise to reduce above normal BMI.          Orders Placed This Encounter   . CYTOPATHOLOGY-GYN (PAP AND HPV TESTS)          The patient has been educated about risk factors and recommended preventive care. Written Prevention Plan completed/ updated and given to patient (see After Visit Summary).        Lucy AntiguaFrances Kennette Cuthrell, MD  [facility]  [facility]  [address]  [place] [phone]  [phone]

## 2017-05-12 NOTE — Progress Notes (Signed)
[facility]  [address]  [place] [address]  [facility]          Encounter Date: 05/12/2017  2:00 PM EDT      Name: Amanda Zhang  Age: 71 y.o.  DOB: [birth year]  Sex: female    Chief Complaint:   Encounter Diagnoses   Name Primary?   . Medicare annual wellness visit, subsequent Yes   . BMI 26.0-26.9,adult        HPI    Aviyanna is a 71 year old female comes in for her annual wellness exam.  She states she is doing very well because she is following the weight watchers diet, decreasing her alcohol to 1 day per week, getting regular walking exercise and restricting acidic foods to early in the day.  This really seems to help her lose weight feel better and is controlling her GERD symptoms.    No problems with driving no incident or accident vision okay response time ok and navigating without difficulty    Appetite good  no recent falls  sleep acceptable/okay   bowels moving regular without sign of bleeding the  voiding okay  up at night- to void 0 times        Past Surgical History:   Procedure Laterality Date   . COLONOSCOPY W/ ENDOSCOPIC FNA/FNB  12/24/2010   . 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 Marital status: Married     Spouse name: N/A   . Number of children: N/A   . Years of education: N/A     Social History Main Topics   . Smoking status: Former Games developer   . Smokeless tobacco: Never Used      Comment: quit 1990   . Alcohol use Yes      Comment: rarely   . Drug use: No   . Sexual activity: No     Other Topics Concern   . Not on file     Social History Narrative     Expanded Substance History     Additional history         Medications      No outpatient prescriptions prior to visit.  No facility-administered medications prior to visit.     History     Review of Systems   Constitutional: Negative for activity  change, appetite change, chills, diaphoresis, fever and unexpected weight change.   HENT: Negative for congestion, dental problem, drooling, ear discharge, facial swelling, hearing loss, mouth sores, nosebleeds, sinus pressure, sneezing, sore throat, tinnitus, trouble swallowing and voice change.    Eyes: Negative for pain, discharge, redness, itching and visual disturbance.   Respiratory: Negative for choking, chest tightness, shortness of breath and wheezing.    Cardiovascular: Negative for chest pain, palpitations and leg swelling.   Gastrointestinal: Negative for abdominal pain, anal bleeding, blood in stool, constipation, diarrhea, nausea and rectal pain.   Endocrine: Negative for cold intolerance, heat intolerance, polydipsia, polyphagia and polyuria.   Genitourinary: Negative for flank pain, frequency, hematuria, pelvic pain, urgency, vaginal bleeding, vaginal discharge and vaginal pain.   Musculoskeletal: Negative for back pain, gait problem and joint swelling.   Skin: Negative for wound.  Allergic/Immunologic: Negative for immunocompromised state.   Neurological: Negative for dizziness, tremors, seizures, syncope, facial asymmetry, speech difficulty, light-headedness and headaches.   Hematological: Negative for adenopathy. Does not bruise/bleed easily.   Psychiatric/Behavioral: Negative for confusion, decreased concentration and self-injury.       Examination  Vitals: BP 118/62  Pulse 62  Ht 1.676 m (5\' 6" )  Wt 73.2 kg (161 lb 6.4 oz)  LMP 11/23/1993  SpO2 98%  BMI 26.05 kg/m2  Physical Exam   Constitutional: She is oriented to person, place, and time. Vital signs are normal. She appears well-developed and well-nourished. She is cooperative. No distress.   HENT:   Head: Normocephalic and atraumatic. Head is without laceration, without right periorbital erythema and without left periorbital erythema. Hair is normal.   Right Ear: Tympanic membrane, external ear and ear canal normal. No lacerations. No  drainage, swelling or tenderness. No foreign bodies.   Left Ear: Tympanic membrane, external ear and ear canal normal. No lacerations. No drainage, swelling or tenderness. No foreign bodies.   Nose: Nose normal. No nose lacerations or sinus tenderness. No epistaxis.   Mouth/Throat: Uvula is midline, oropharynx is clear and moist and mucous membranes are normal. No oropharyngeal exudate.   Eyes: Conjunctivae, EOM and lids are normal. Pupils are equal, round, and reactive to light. Right eye exhibits no discharge and no exudate. Left eye exhibits no discharge and no exudate. No scleral icterus.   Neck: Normal range of motion, full passive range of motion without pain and phonation normal. Neck supple. Normal carotid pulses, no hepatojugular reflux and no JVD present. No tracheal tenderness present. Carotid bruit is not present. No tracheal deviation, no edema and no erythema present. No thyroid mass and no thyromegaly present.   Cardiovascular: Normal rate, regular rhythm, normal heart sounds and intact distal pulses.    Pulmonary/Chest: Effort normal and breath sounds normal. No accessory muscle usage or stridor. No respiratory distress. She has no wheezes. She has no rhonchi. She has no rales. She exhibits no tenderness.   Abdominal: Soft. Normal appearance, normal aorta and bowel sounds are normal. She exhibits no distension and no mass. There is no hepatosplenomegaly or hepatomegaly. There is no tenderness. There is no rigidity, no rebound, no guarding, no CVA tenderness, no tenderness at McBurney's point and negative Murphy's sign. No hernia. Hernia confirmed negative in the right inguinal area and confirmed negative in the left inguinal area.   Genitourinary: Uterus normal. Rectal exam shows no external hemorrhoid, no internal hemorrhoid, no fissure, no mass, no tenderness, anal tone normal and guaiac negative stool. No breast swelling, tenderness, discharge or bleeding. Pelvic exam was performed with patient  supine. There is no rash, tenderness, lesion or injury on the right labia. There is no rash, tenderness, lesion or injury on the left labia. Uterus is not deviated, not enlarged, not fixed and not tender. Cervix exhibits no motion tenderness, no discharge and no friability. Right adnexum displays no mass, no tenderness and no fullness. Left adnexum displays no mass, no tenderness and no fullness. No erythema, tenderness or bleeding in the vagina. No foreign body in the vagina. No signs of injury around the vagina. No vaginal discharge found.   Musculoskeletal: Normal range of motion. She exhibits no edema, tenderness or deformity.   Lymphadenopathy:     She has no cervical adenopathy.     She has no axillary adenopathy.        Right: No inguinal adenopathy present.  Left: No inguinal adenopathy present.   Neurological: She is alert and oriented to person, place, and time. She has normal strength and normal reflexes. She displays no tremor. No cranial nerve deficit or sensory deficit. She exhibits normal muscle tone. Coordination normal.   Skin: Skin is warm, dry and intact. No abrasion, no bruising, no ecchymosis, no petechiae and no rash noted. She is not diaphoretic. No cyanosis or erythema. Nails show no clubbing.   Psychiatric: She has a normal mood and affect. Her speech is normal and behavior is normal. Judgment and thought content normal.   Nursing note and vitals reviewed.    Ortho Exam    Assessment and Plan  Else was seen today for well check adult.    Medicare annual wellness visit, subsequent  -     CYTOPATHOLOGY-GYN (PAP AND HPV TESTS); Future    BMI 26.0-26.9,adult      Clinical Support on 05/04/2017   Component Date Value Ref Range Status   . SODIUM 05/04/2017 142  135 - 145 mmol/L Final   . POTASSIUM 05/04/2017 4.6  3.5 - 5.0 mmol/L Final   . CHLORIDE 05/04/2017 108  98 - 111 mmol/L Final   . CO2 TOTAL 05/04/2017 25  21 - 35 mmol/L Final   . ANION GAP 05/04/2017 9  mmol/L Final   . CALCIUM  05/04/2017 9.6  8.2 - 10.2 mg/dL Final   . GLUCOSE 47/82/9562 94  70 - 110 mg/dL Final   . BUN 13/06/6577 14  10 - 25 mg/dL Final   . CREATININE 46/96/2952 0.81  <=1.30 mg/dL Final   . BUN/CREA RATIO 05/04/2017 17   Final   . ESTIMATED GFR 05/04/2017 >60  Avg: 75 mL/min/1.82m^2 Final   . ALBUMIN 05/04/2017 3.8  3.2 - 4.6 g/dL Final   . ALKALINE PHOSPHATASE 05/04/2017 64  20 - 130 U/L Final   . ALT (SGPT) 05/04/2017 19  <=52 U/L Final   . AST (SGOT) 05/04/2017 22  <=35 U/L Final   . BILIRUBIN TOTAL 05/04/2017 0.6  0.3 - 1.2 mg/dL Final   . BILIRUBIN DIRECT 05/04/2017 0.1  <=0.3 mg/dL Final   . PROTEIN TOTAL 05/04/2017 6.4  6.0 - 8.3 g/dL Final   . HDL CHOL 84/13/2440 45  >40 mg/dL Final   . TRIGLYCERIDES 05/04/2017 100  0 - 199 mg/dL Final   . VLDL CALC 09/19/2535 20  0 - 50 mg/dL Final   . CHOLESTEROL 64/40/3474 172  0 - 199 mg/dL Final   . LDL DIRECT 25/95/6387 92  0 - 99 mg/dL Final   . THYROXINE (T4), FREE 05/04/2017 1.01  0.61 - 1.12 ng/dL Final   . TSH 56/43/3295 2.719  0.450 - 5.330 uIU/mL Final   . WBC 05/04/2017 5.6  3.5 - 10.3 x10^3/uL Final   . RBC 05/04/2017 4.64  3.80 - 5.24 x10^6/uL Final   . HGB 05/04/2017 14.0  11.8 - 15.8 g/dL Final   . HCT 18/84/1660 42.1  34.6 - 46.2 % Final   . MCV 05/04/2017 90.8  82.3 - 96.7 fL Final   . MCH 05/04/2017 30.2  27.6 - 33.2 pg Final   . MCHC 05/04/2017 33.2  32.6 - 35.4 g/dL Final   . RDW 63/11/6008 13.9  12.4 - 15.2 % Final   . PLATELETS 05/04/2017 335  140 - 440 x10^3/uL Final   . MPV 05/04/2017 8.6  6.6 - 10.2 fL Final   . NEUTROPHIL % 05/04/2017 55  % Final   .  LYMPHOCYTE % 05/04/2017 29  % Final   . MONOCYTE % 05/04/2017 9  % Final   . EOSINOPHIL % 05/04/2017 5  % Final   . BASOPHIL % 05/04/2017 2  % Final   . NEUTROPHIL # 05/04/2017 3.00  1.50 - 6.40 x10^3/uL Final   . LYMPHOCYTE # 05/04/2017 1.60  0.90 - 3.40 x10^3/uL Final   . MONOCYTE # 05/04/2017 0.50  0.20 - 0.90 x10^3/uL Final   . EOSINOPHIL # 05/04/2017 0.30  0.00 - 0.50 x10^3/uL Final   . BASOPHIL #  05/04/2017 0.10  0.00 - 0.20 x10^3/uL Final     Interval history reviewed with patient.    Available laboratory studies, diagnostic studies and physician reports were reviewed.  Better    Continue current medication regimen.      Heart healthy lifestyle-heart healthy diet guidelines can be found by reading about the Mediterranean diet and the dash diet.  They both recommend no fried food no fast foods and limiting your red meat and full fatty dairy to only once or twice per month.  Limit sodium intake to take 2000 meq per day.  Try a no added salt and no added sugar diet.  Increase her intake of fresh fruits and healthy fats from nuts and olive oil.  Get regular exercise 5-7 days per week-30-60 minute work outs.  Be active, try not to sit more than 30 minutes at a time.  It 7-8 hours of sleep per night.  Decrease or discontinue your nicotine consumption.      Work on weight loss.  All diet plans have been proven effective when you do the work.  The low sugar and low starch diet have been shown to have more rapid weight loss.  Your goal should be to lose 10% of your current body weight.  The final goal would be to have an ideal body mass index but most importantly you should be healthy.  Your diet should be based on improving diets like the Mediterranean diet or the dash diet for overall health.  They encourage eating lots of fresh fruits and vegetables, healthy fats like olive oil, nuts, fish and poultry, and whole grains; and limiting high fat foods like fried foods  fast foods and red meats.  Make sure you're dairy products are low or non-fat varieties.  Add or increase exercise 5-7 days per week for 1-2 hours at a time.  Losing weight can seem like an overwhelming task.  Often life stresses get in the way.  But you can improve your health by making small changes.  For list of healthy tips to help you lose weight have someone help you or looked up on the Internet, articles that I have personally found helpful- 50  weight loss tips by Audie Box or 100 best ever weight loss tips primary add years of eat this, not that.      BMI addressed: Advised on diet, weight loss, and exercise to reduce above normal BMI.                    Lucy Antigua, MD

## 2017-05-12 NOTE — Nursing Note (Signed)
Left elbow cyst healing after 7 months  Wellness PE for medicare

## 2017-05-17 ENCOUNTER — Telehealth (INDEPENDENT_AMBULATORY_CARE_PROVIDER_SITE_OTHER): Payer: Self-pay | Admitting: Family Medicine

## 2017-05-17 ENCOUNTER — Other Ambulatory Visit (INDEPENDENT_AMBULATORY_CARE_PROVIDER_SITE_OTHER): Payer: Self-pay | Admitting: Family Medicine

## 2017-05-17 DIAGNOSIS — R936 Abnormal findings on diagnostic imaging of limbs: Secondary | ICD-10-CM

## 2017-05-17 MED ORDER — ALENDRONATE 70 MG TABLET
70.0000 mg | ORAL_TABLET | ORAL | 12 refills | Status: DC
Start: 2017-05-17 — End: 2017-05-17

## 2017-05-17 MED ORDER — ALENDRONATE 70 MG TABLET
70.0000 mg | ORAL_TABLET | ORAL | 12 refills | Status: DC
Start: 2017-05-17 — End: 2018-05-17

## 2017-05-17 MED ORDER — ALENDRONATE 70 MG TABLET: 70 mg | Tab | ORAL | 12 refills | 0 days | Status: CN

## 2017-05-17 NOTE — Nursing Note (Signed)
Spoke to patient.  Fosamax and xray of right femur in chart

## 2017-05-17 NOTE — Telephone Encounter (Signed)
-----   Message from [place] [facility] sent at 05/17/2017  3:04 PM EDT -----  Contact: [phone]  Patient got results back from Dexa Scan this weekend off mychart, she is wanting to discuss them, please call her as soon as you can. Thank you

## 2017-05-18 ENCOUNTER — Ambulatory Visit
Admission: RE | Admit: 2017-05-18 | Discharge: 2017-05-18 | Disposition: A | Discharge status: Home / Self Care | Payer: Medicare Other | Source: Ambulatory Visit | Attending: Family Medicine | Admitting: Family Medicine

## 2017-05-18 DIAGNOSIS — Z96651 Presence of right artificial knee joint: Secondary | ICD-10-CM | POA: Insufficient documentation

## 2017-05-18 DIAGNOSIS — R936 Abnormal findings on diagnostic imaging of limbs: Secondary | ICD-10-CM | POA: Insufficient documentation

## 2017-05-18 LAB — HUMAN PAPILLOMA VIRUS (HPV) BY PCR WITH HIGH RISK GENOTYPING (SUREPATH)
HPV OTHER: NEGATIVE
HPV16 PCR: NEGATIVE
HPV18 PCR: NEGATIVE

## 2017-05-20 ENCOUNTER — Encounter (INDEPENDENT_AMBULATORY_CARE_PROVIDER_SITE_OTHER): Payer: Self-pay | Admitting: Family Medicine

## 2017-10-22 IMAGING — MG MAMMOGRAPHY SCREENING BILATERAL 3D TOMOSYNTHESIS WITH CAD
4 of 12 series · 4 of 40 positions shown · non-contrast
Comparison: 09/18/2016 
BREAST DENSITY: (Level B) There are scattered areas of fibroglandular density.

MAMMOGRAPHY SCREENING BILATERAL 3D TOMOSYNTHESIS WITH CAD, 10/22/2017 [DATE]: 
CLINICAL INDICATION: Screening exam.
TECHNIQUE: Digital bilateral mammograms and 3-D Tomosynthesis were obtained. 
These were interpreted both primarily and with the aid of computer-aided 
detection system.

[R MLO synth-2D]
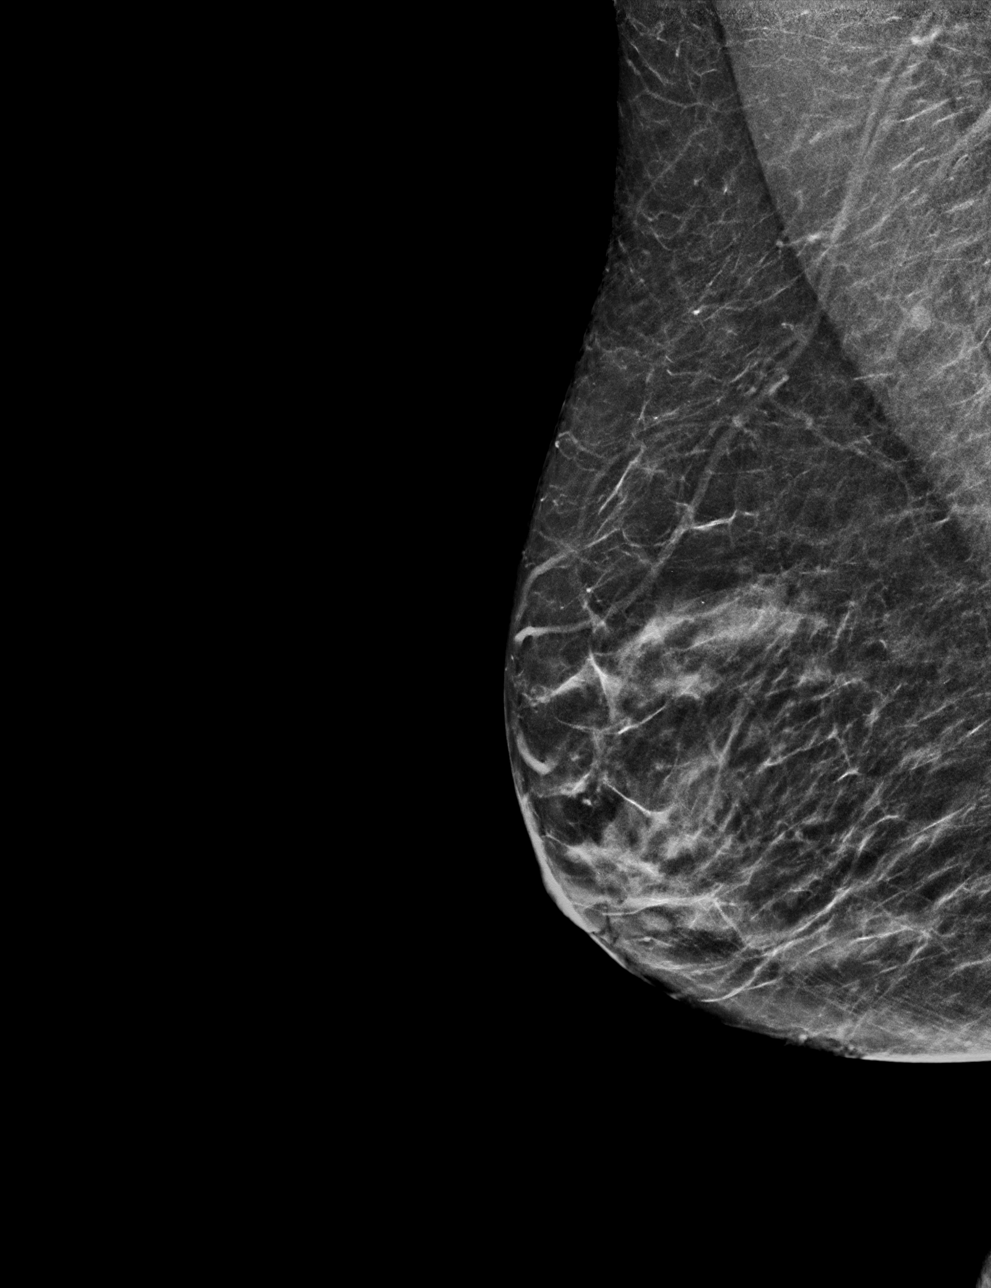

[L CC synth-2D]
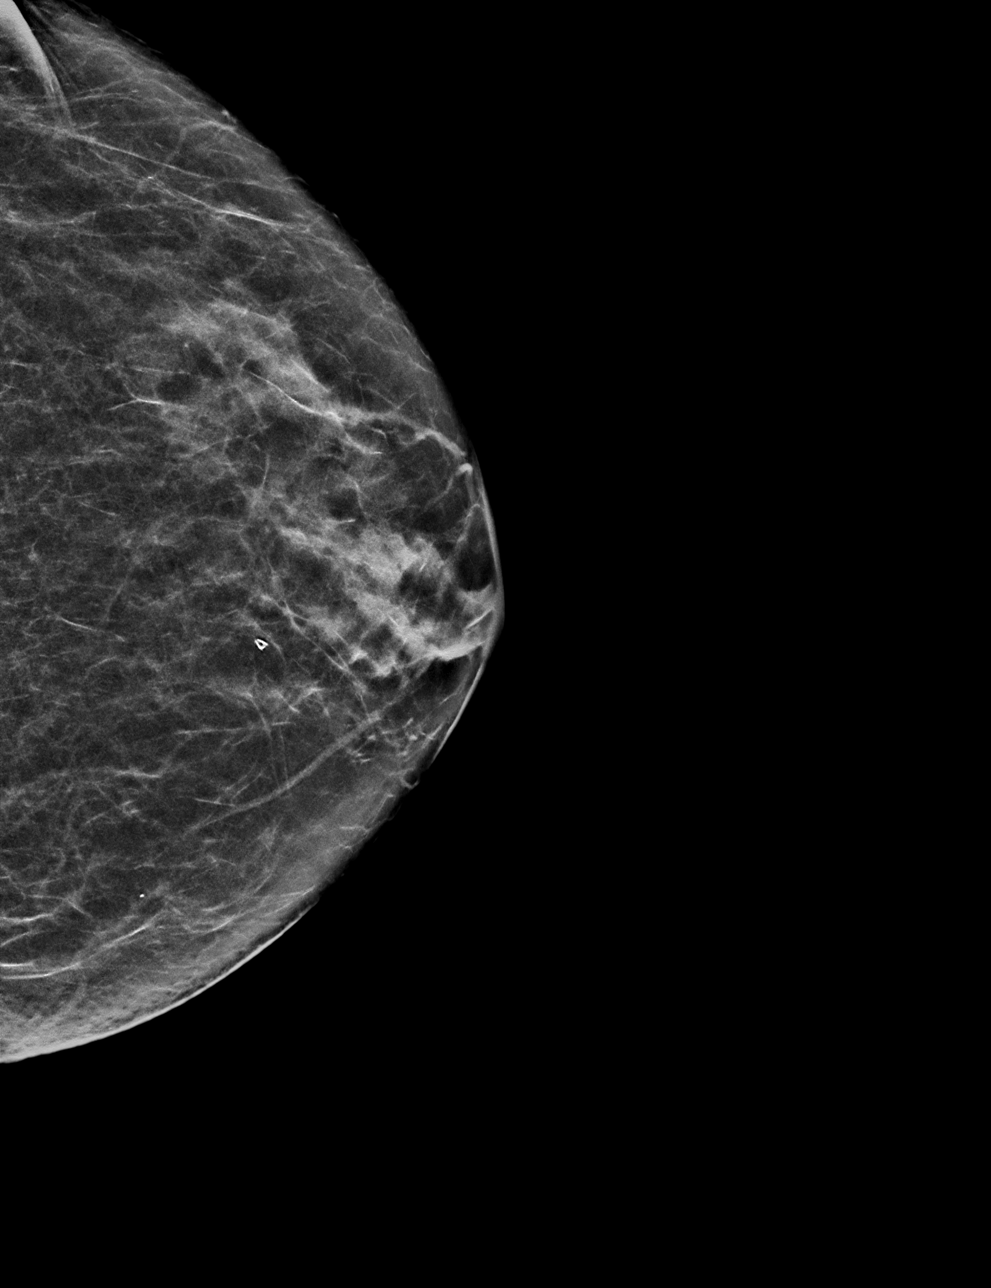

[R CC synth-2D]
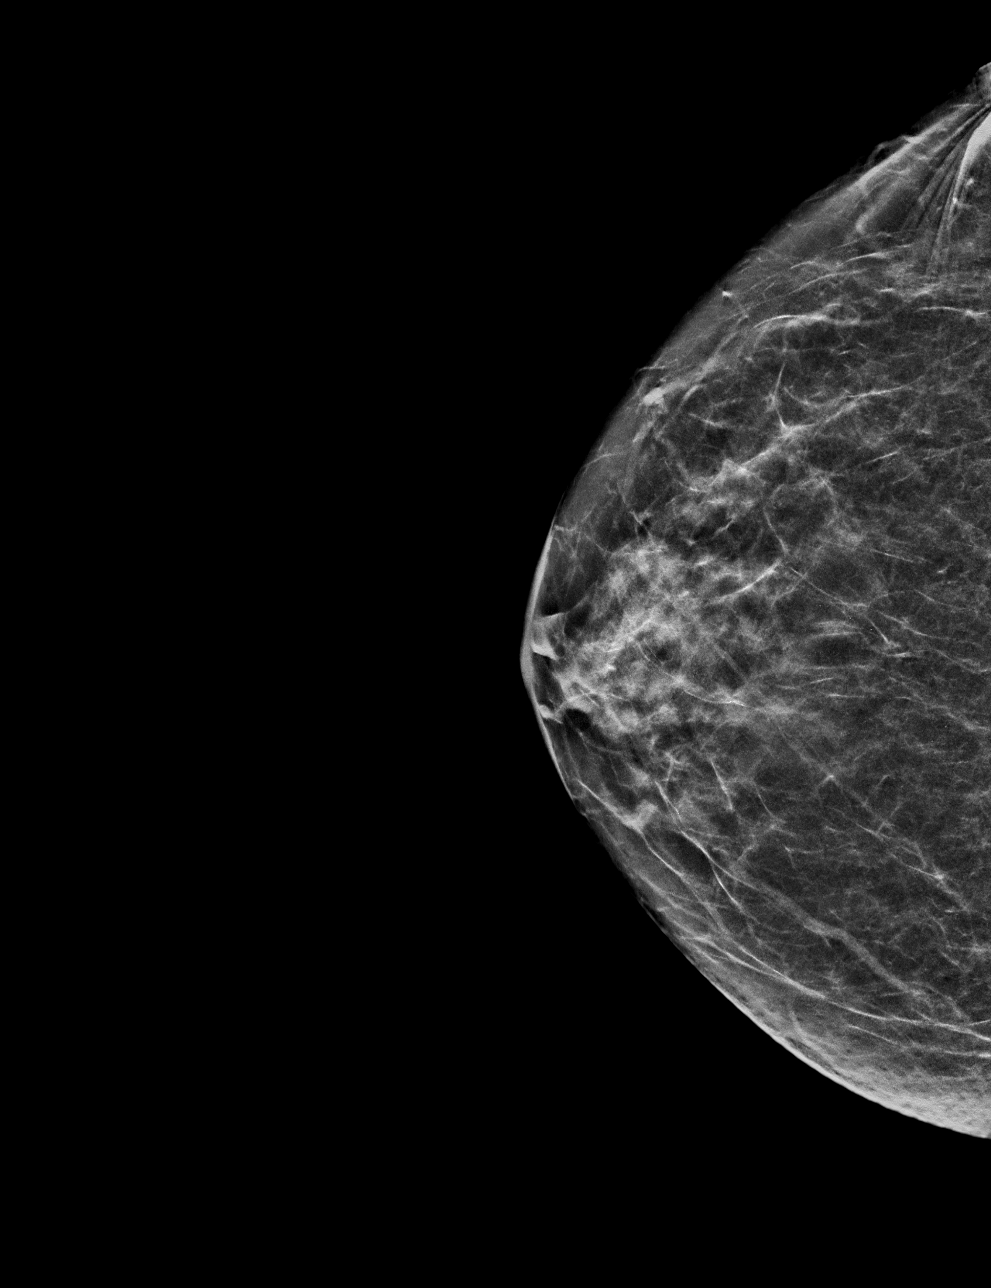

[L MLO synth-2D]
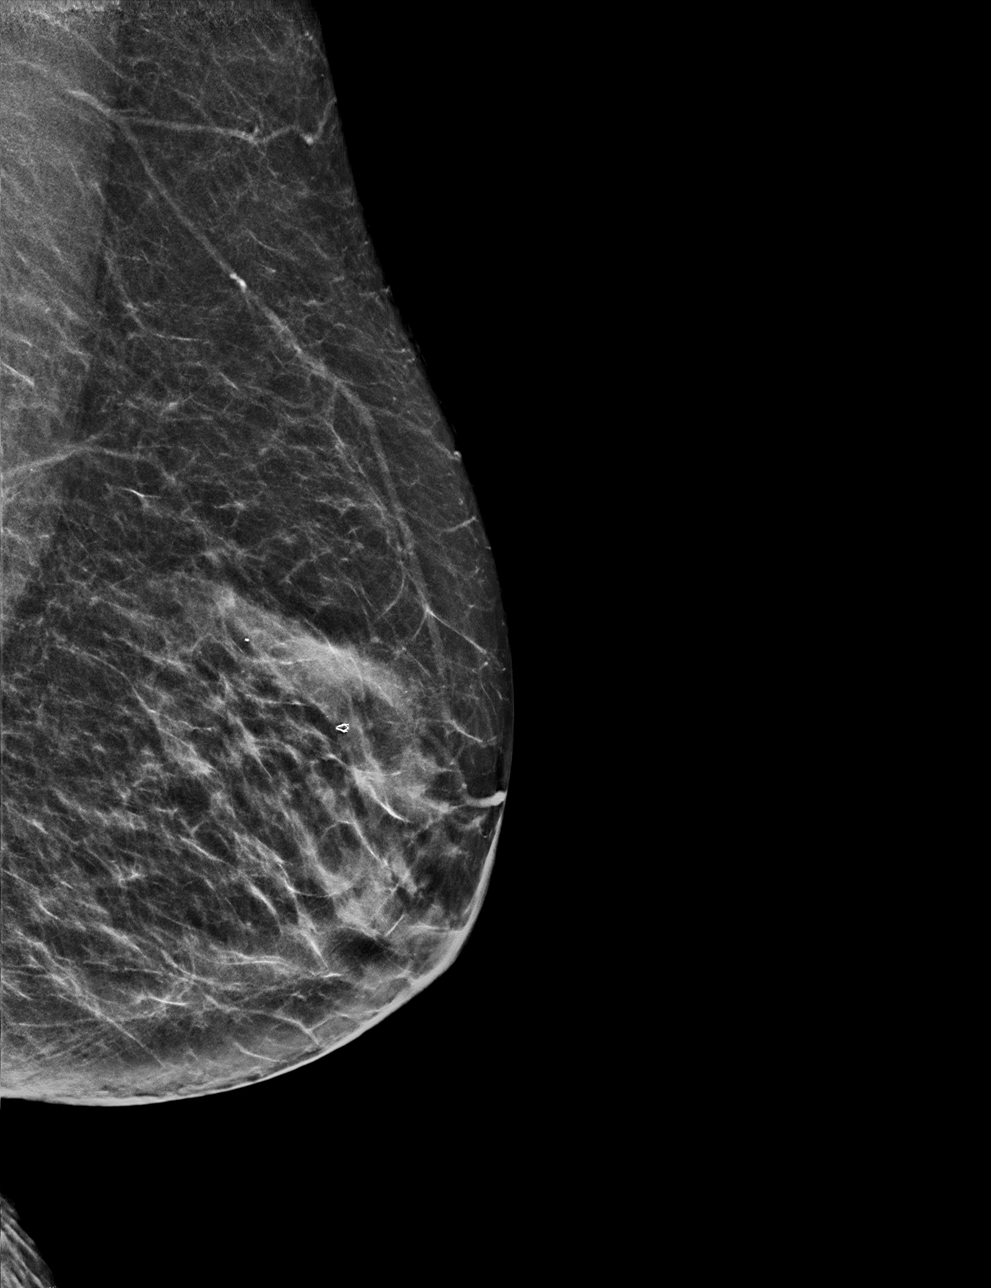

[4 of 40 positions shown; findings below may reference images not displayed]

FINDINGS: Postoperative tissue marker left breast. No mass, area of 
architectural distortion or suspicious calcification. No mammographic evidence 
of malignancy.
IMPRESSION: ( BI-RADS 2) Benign findings. Routine mammographic follow-up is recommended.

## 2018-01-05 IMAGING — DX CHEST PA AND LATERAL
1 series · 2 of 2 positions shown · non-contrast
Comparison: none

CLINICAL INDICATION:  72-year-old female with cough and congestion since 
November 2017 
COMPARISON EXAMINATION: None
TECHNIQUE: 2 views of the chest were performed

[Series 1: PA · U · 0.14mm/px · 2 of 2 slices shown]
[im 1/2]
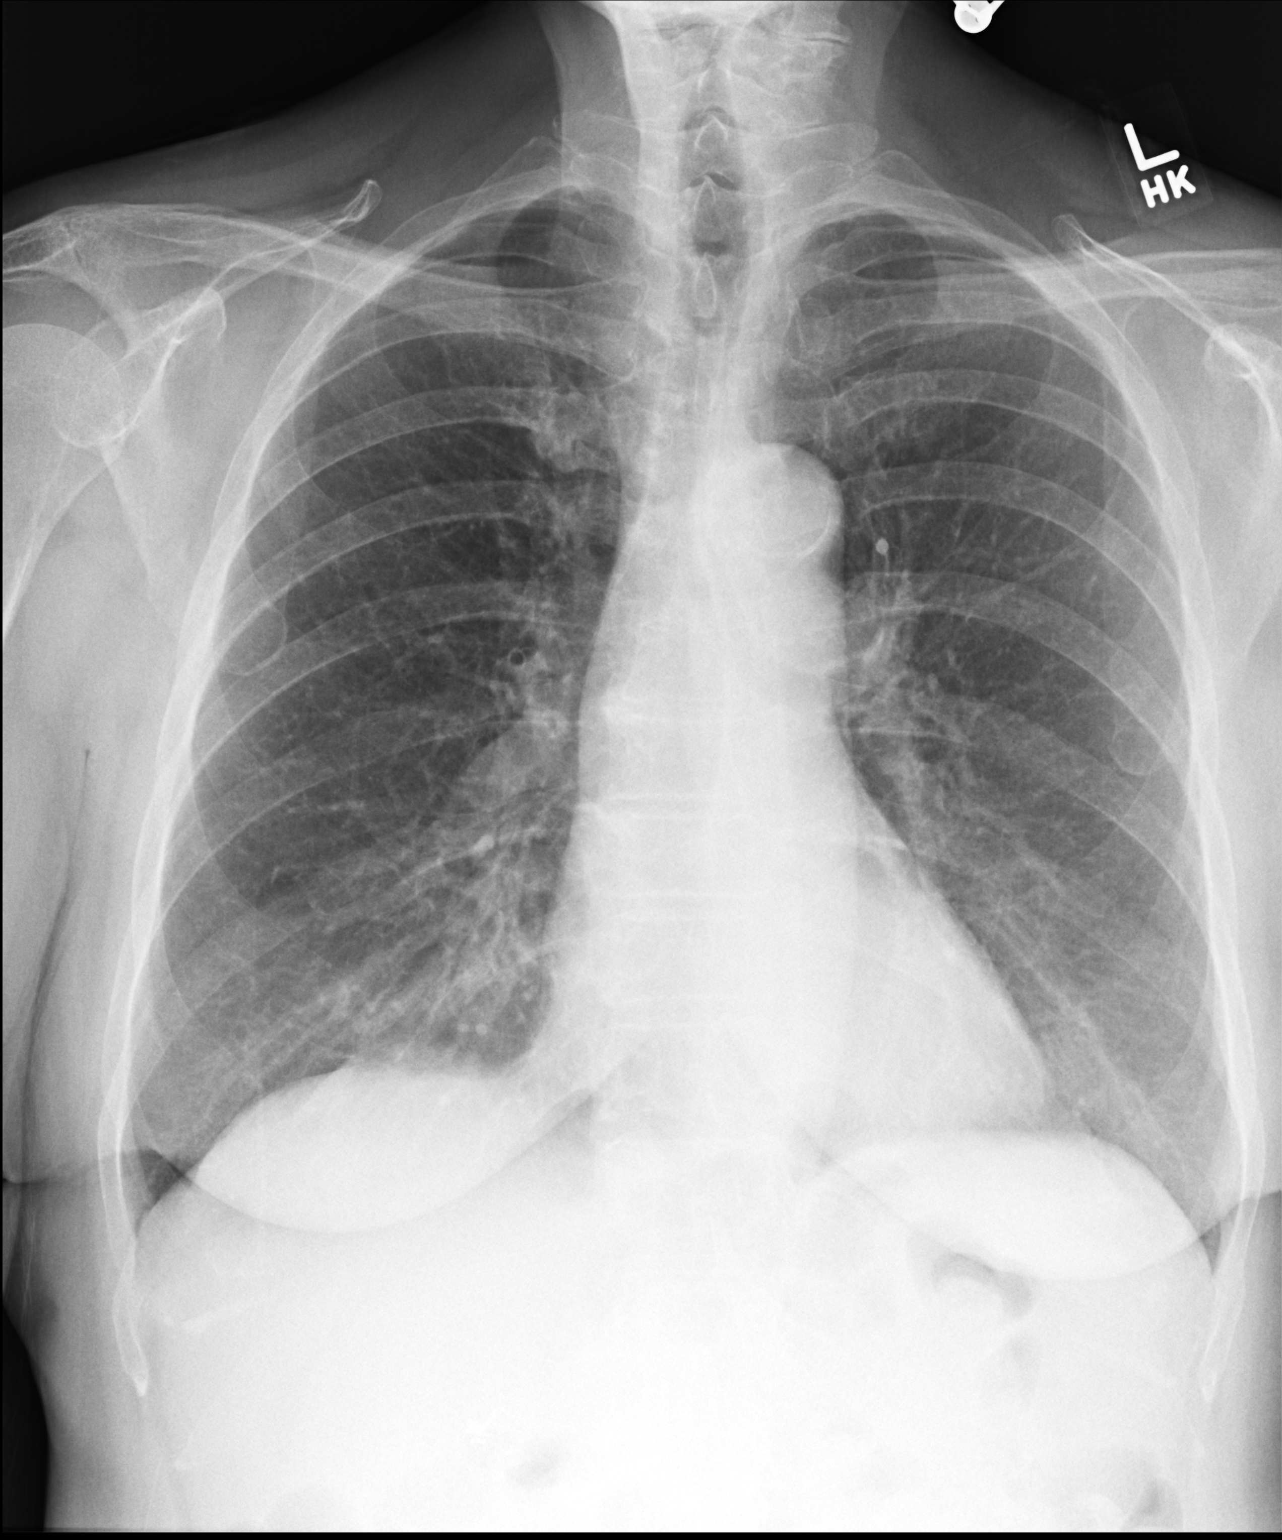
[im 2/2]
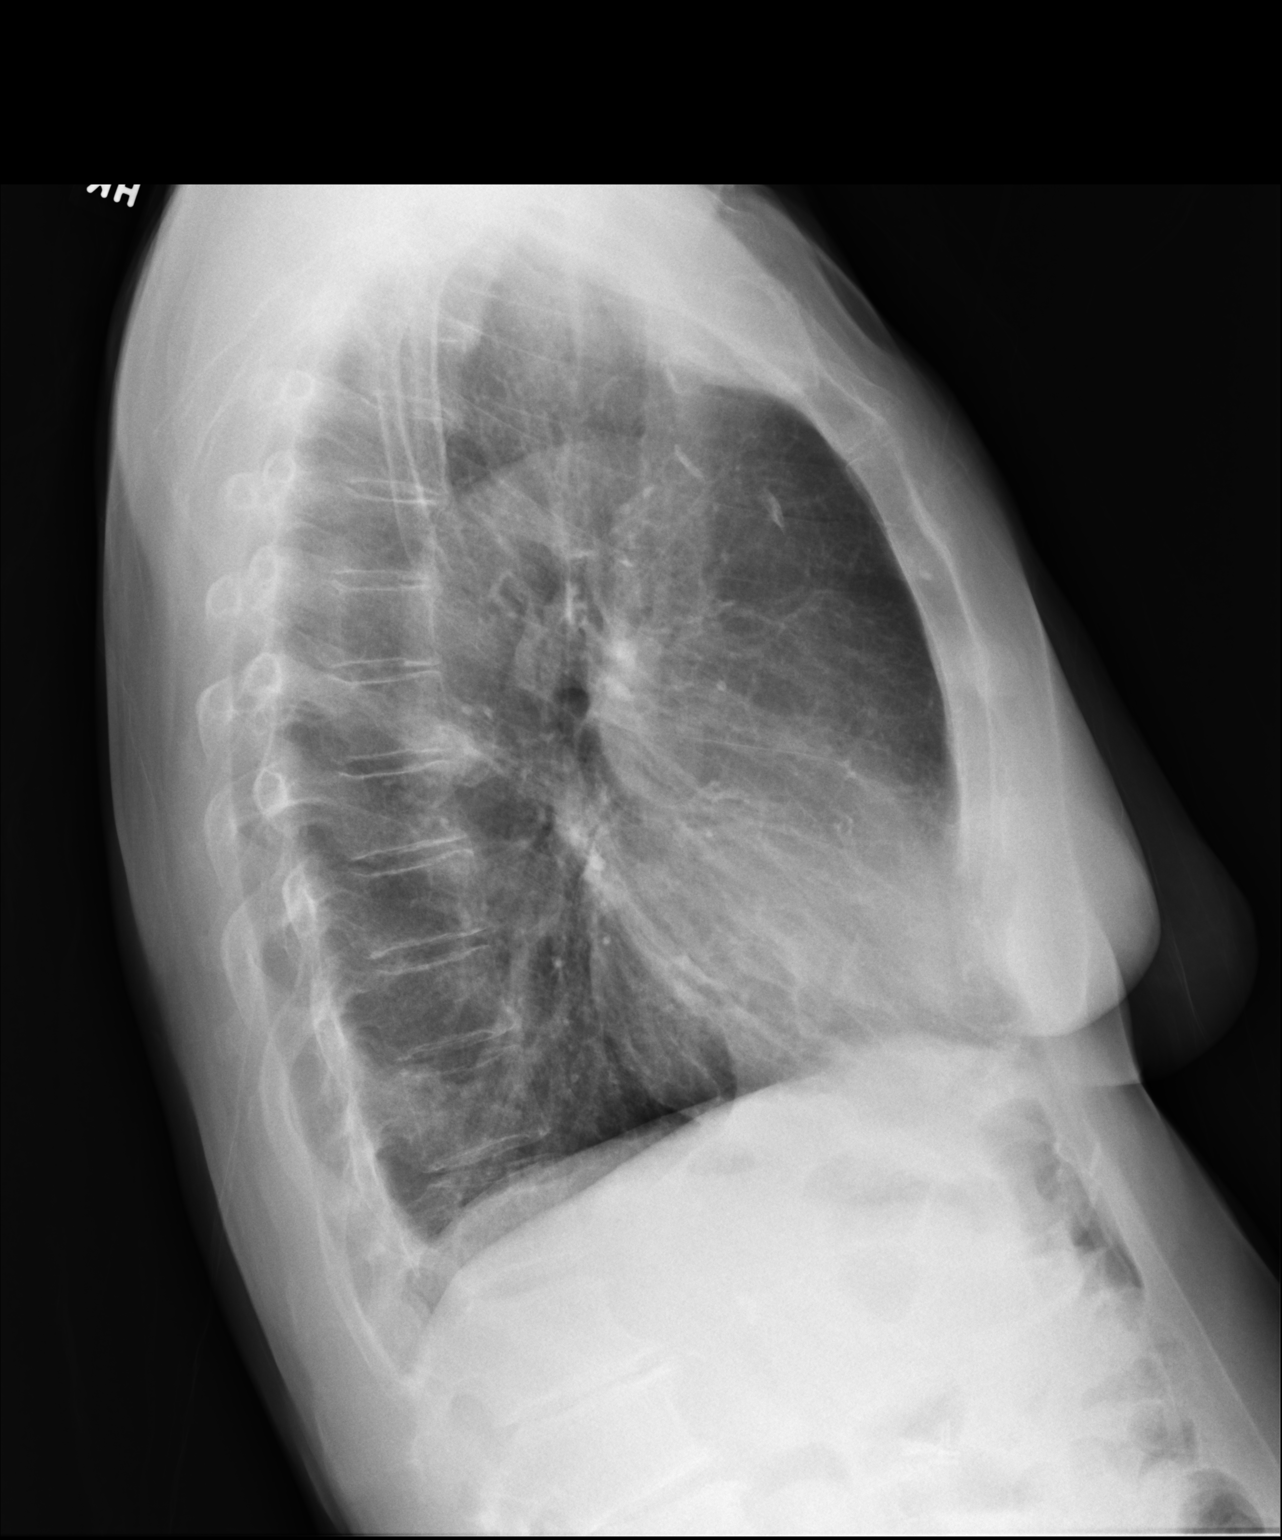

[2 of 2 positions shown; findings below may reference images not displayed]

FINDINGS: Normal heart size. 
Mild reticular coarsening of the bronchovascular interstitium. 
Lungs are hyperlucent and hyperexpanded consistent with emphysema. 
Mild ill-defined right infrahilar opacity with associated bronchial wall 
thickening. 
Aorta is atherosclerotic. 
Degenerative lower cervical osteophytic change. 
Mild degenerative change of the midthoracic disc spaces. No compression 
fracture.
IMPRESSION: Emphysema. 
Possible right infrahilar pneumonia and bronchitis. Recommend treatment and 
short interval follow-up chest x-ray.

## 2018-01-24 IMAGING — DX CHEST PA AND LATERAL
1 series · 2 of 2 positions shown · non-contrast
Comparison: none

CLINICAL INDICATION:  72-year-old female with cough 
COMPARISON EXAMINATION: 01/05/2018 at which time possible right infrahilar 
pneumonia and bronchitis were reported
TECHNIQUE: 2 views of the chest

[Series 1: PA · U · 0.14mm/px · 2 of 2 slices shown]
[im 1/2]
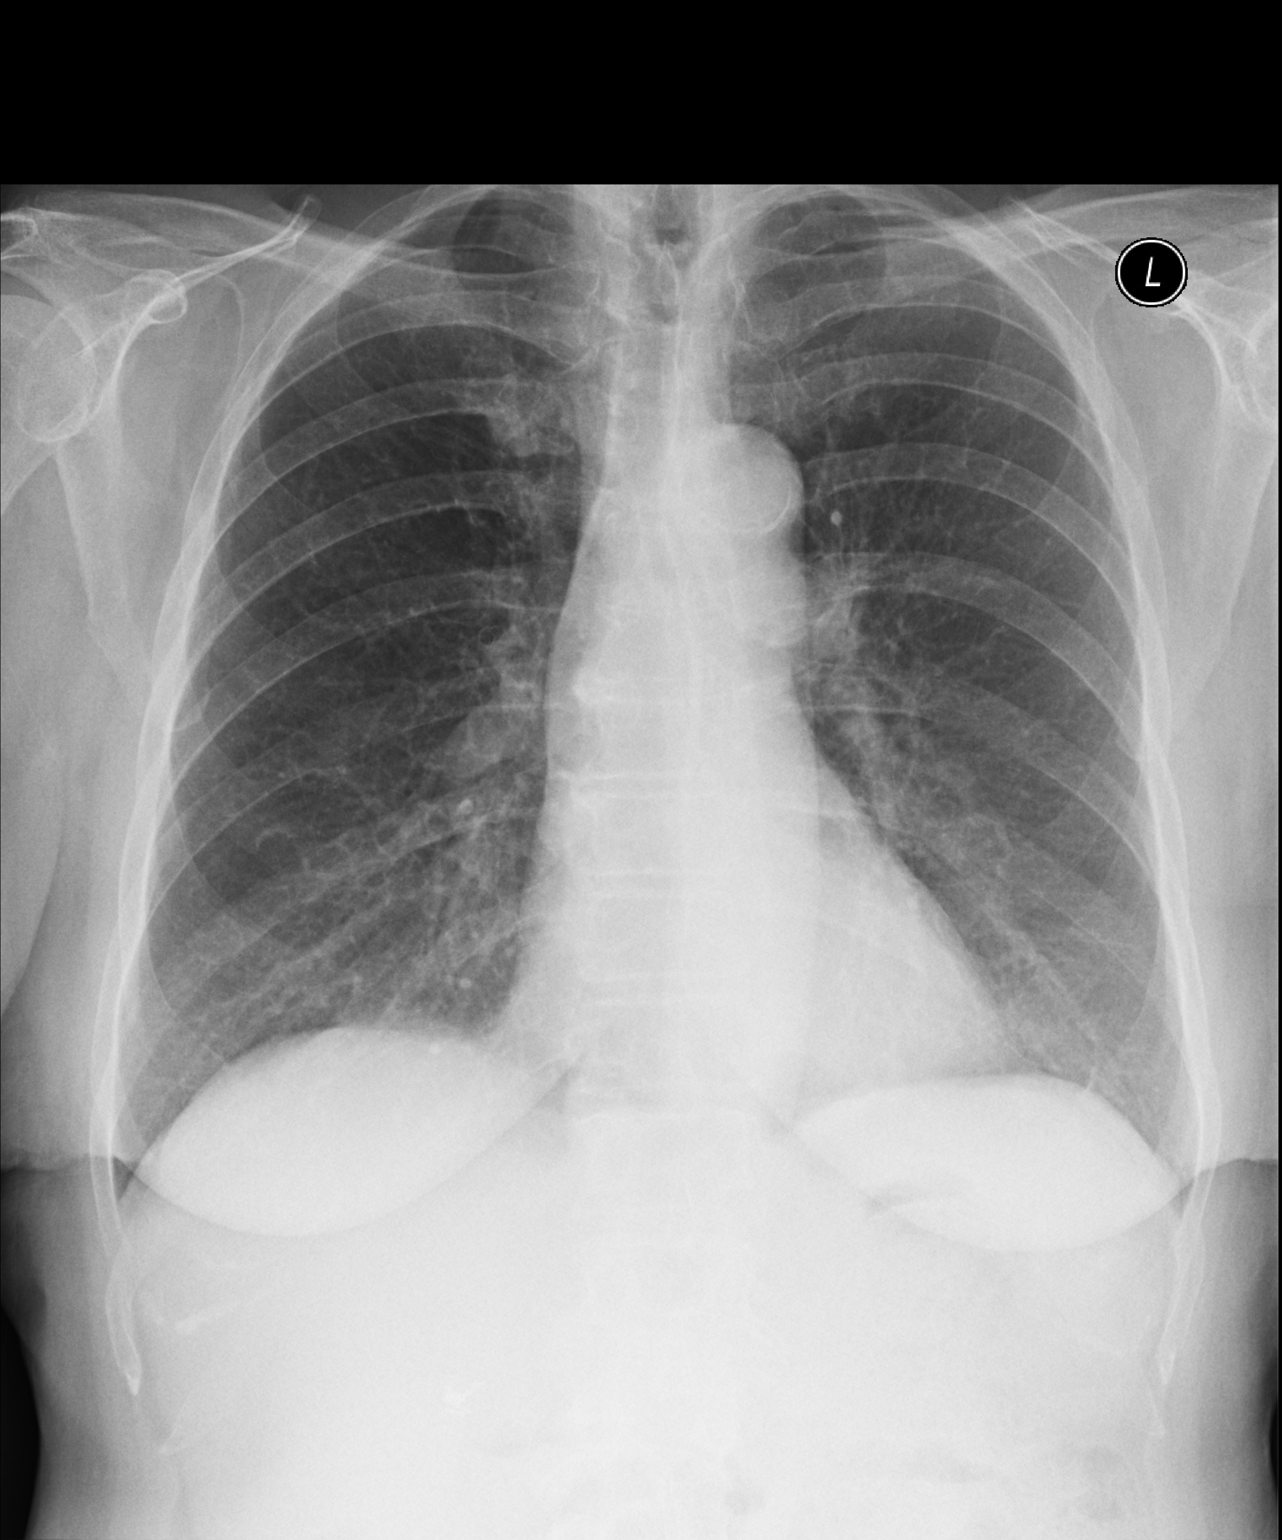
[im 2/2]
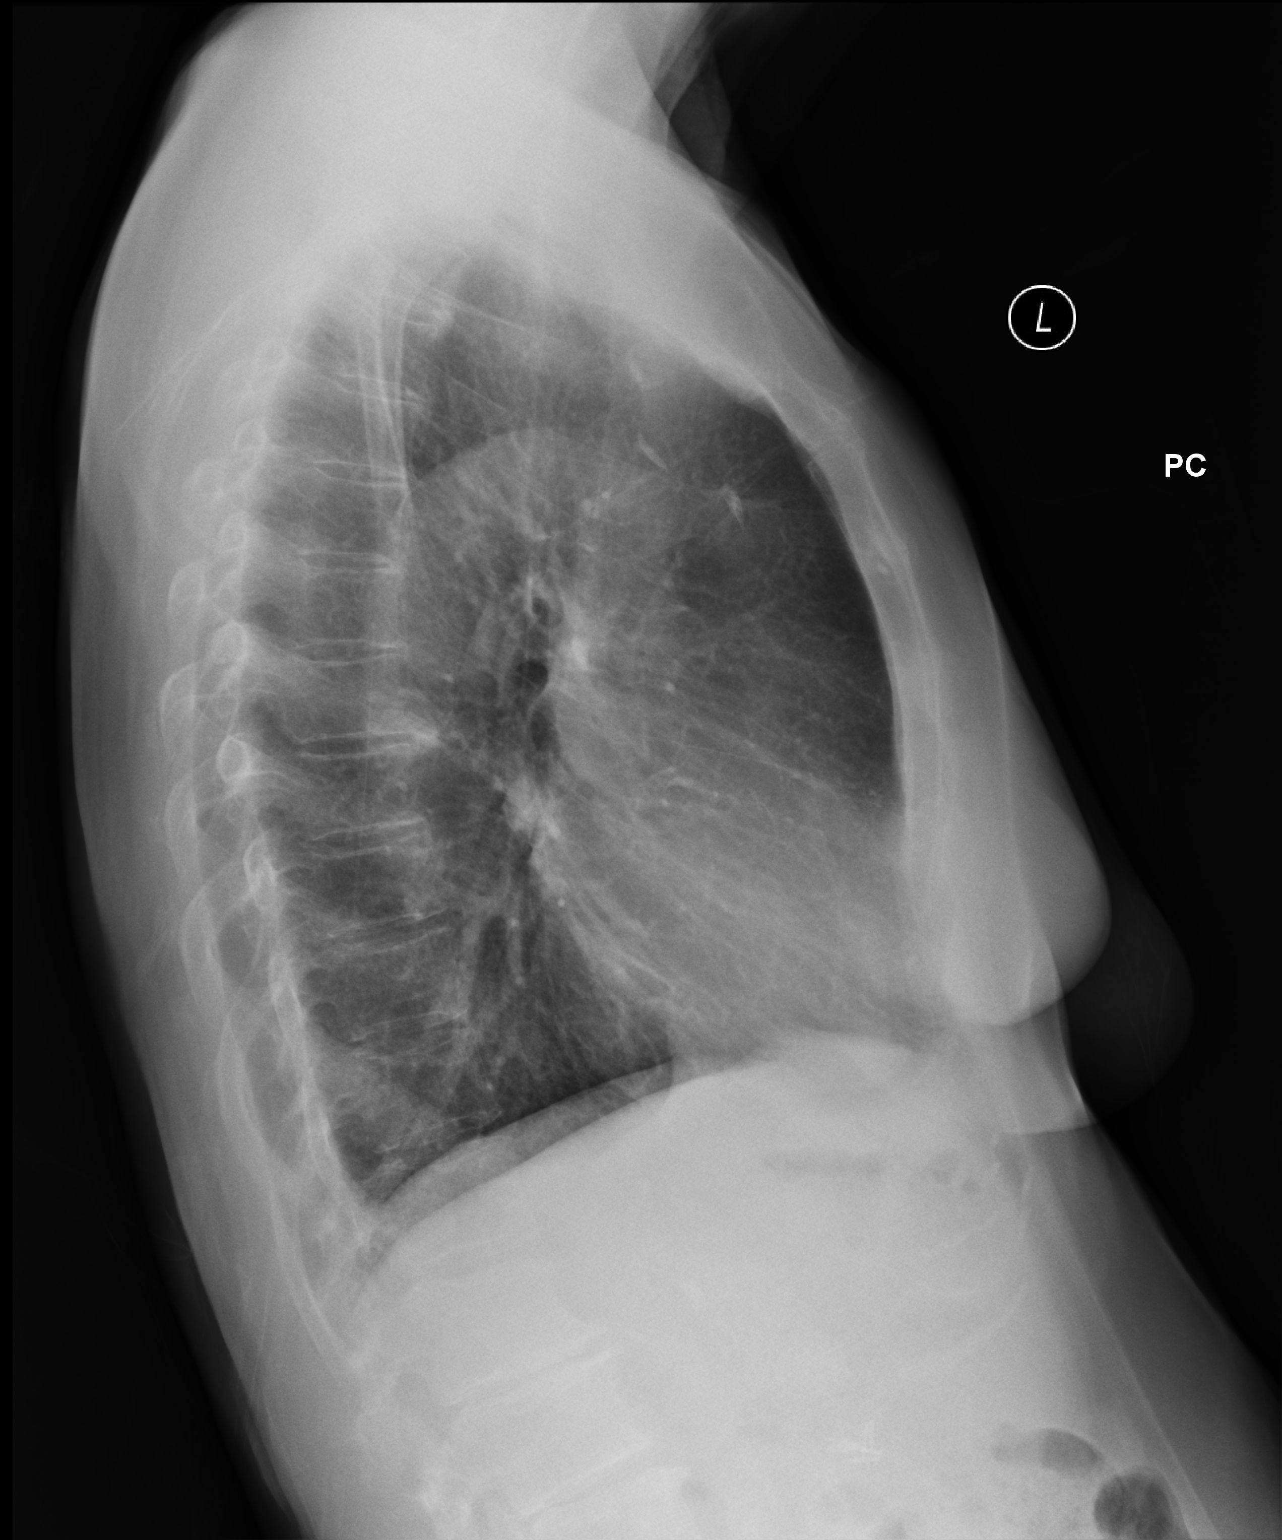

[2 of 2 positions shown; findings below may reference images not displayed]

FINDINGS: Normal heart size. 
Lungs are hyperlucent with increased retrosternal clear space compatible with 
emphysema. 
Aortic arch is atherosclerotic. 
Improved appearance of the right infrahilar ill-defined opacity with bronchial 
wall thickening. Minimal residual may persist. No pleural effusion. 
Lower cervical spine image shows degenerative arthritis.
IMPRESSION: Improved appearance of the right infrahilar ill-defined opacity with bronchial 
wall thickening. No discrete pneumonia. Mild bronchitis may persist. 
Emphysema.

## 2018-02-14 IMAGING — CT CT CHEST WITH CONTRAST
2 of 3 series · 15 of 36 positions shown, 18 images · IV contrast (ISOVUE 300)
Comparison: There are no previous exams available for comparison.

CT CHEST WITH CONTRAST, 02/14/2018 [DATE]: 
CLINICAL INDICATION: Emphysema. Bronchitis. 
The patient's eGFR was calculated to be 87.4 using the i-STAT device. 
A search for DICOM formatted images was conducted for prior CT imaging studies 
completed at a non-affiliated media free facility.
TECHNIQUE: The chest was scanned from base of neck through the lung bases with 
544cc of Isovue 300 injected intravenously on a high resolution low dose CT 
scanner.  Routine MPR and MIP 3D renderings were reconstructed on an 
independent 
workstation with concurrent physician supervision.

[Series 4: chest 2.0 i31s 3 · axial · 0.74mm/px · z∈[-308,-34]mm · 12 of 161 slices shown, 15 images]
[im 12/161  mediastinal]
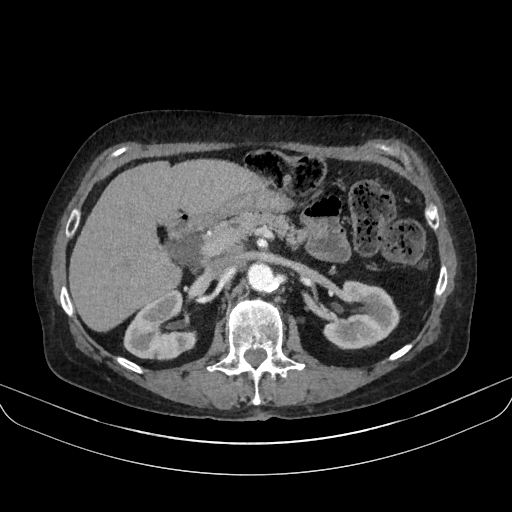
[im 12/161  lung]
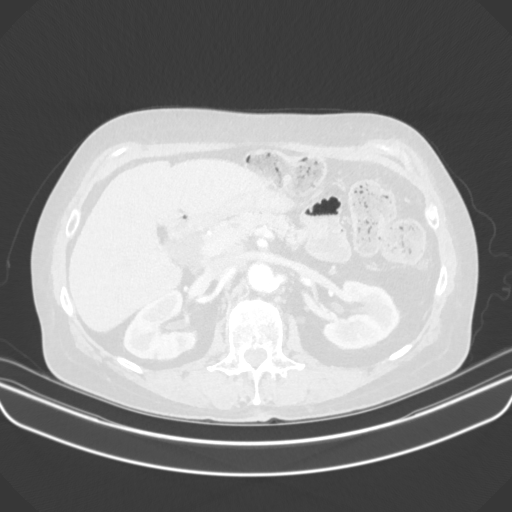
[im 24/161  lung]
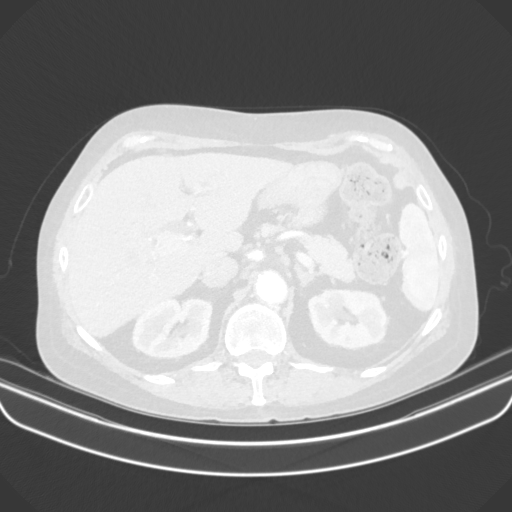
[im 36/161  lung]
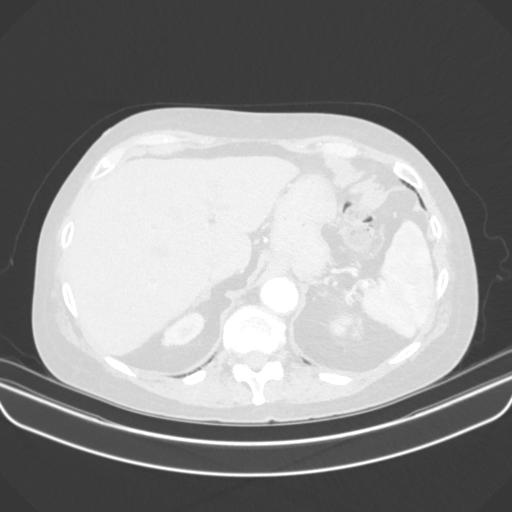
[im 48/161  lung]
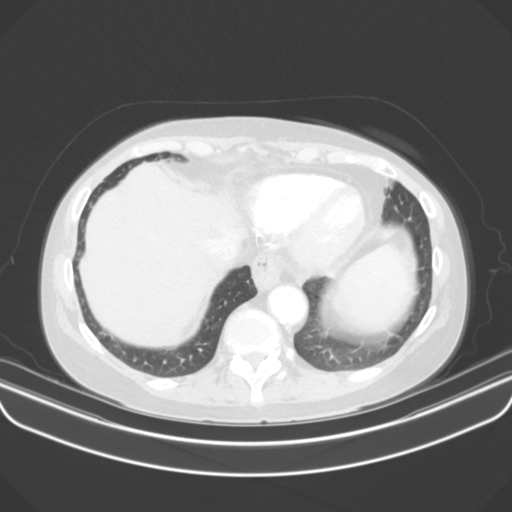
[im 60/161  mediastinal]
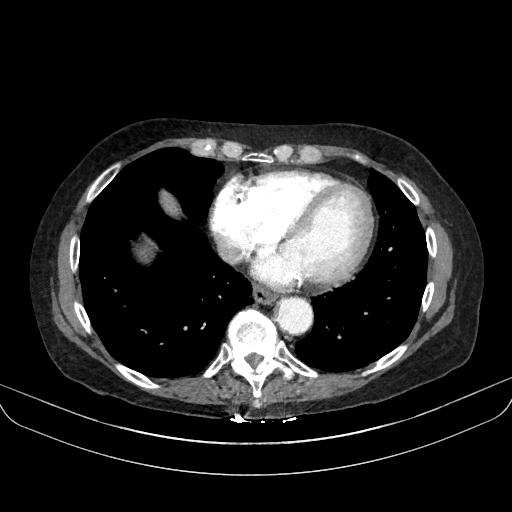
[im 60/161  lung]
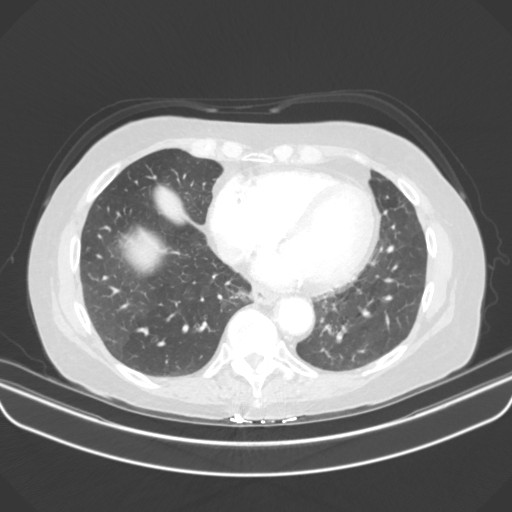
[im 72/161  lung]
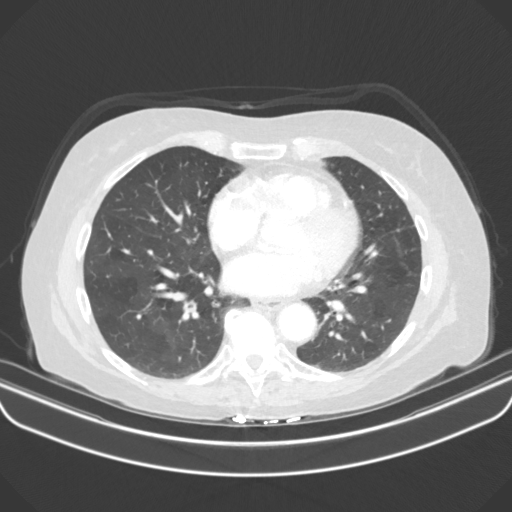
[im 89/161  lung]
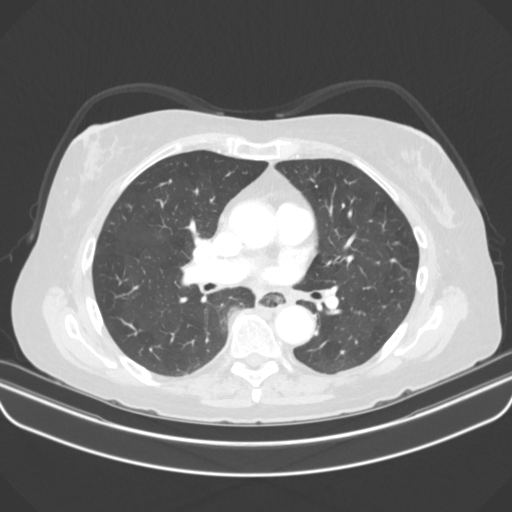
[im 101/161  lung]
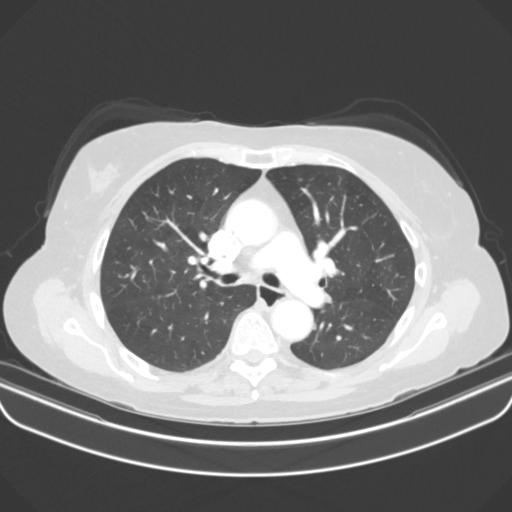
[im 113/161  mediastinal]
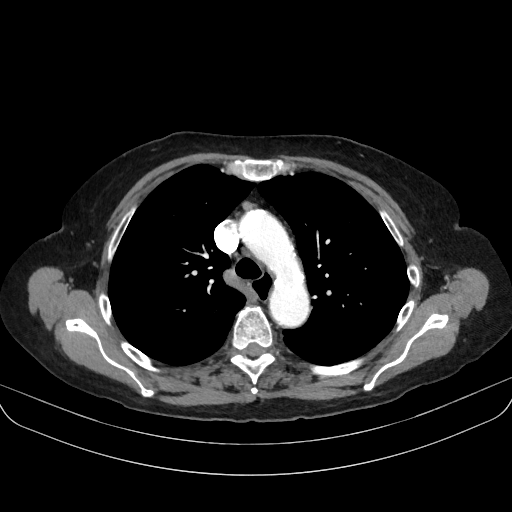
[im 113/161  lung]
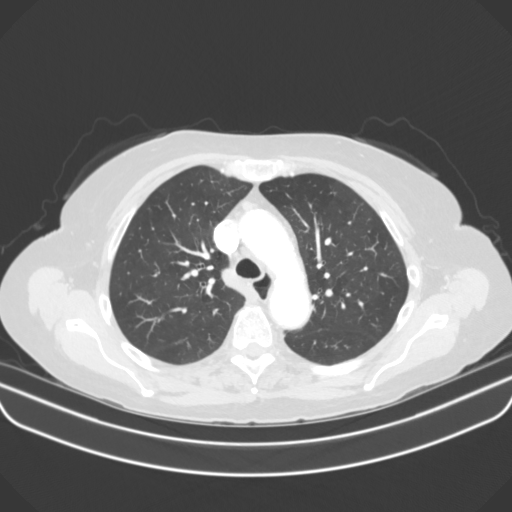
[im 125/161  lung]
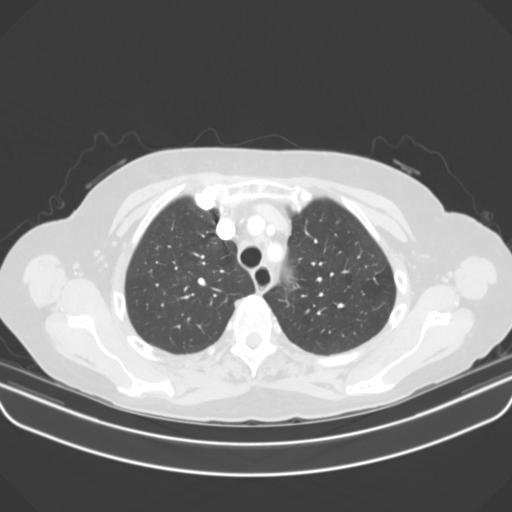
[im 137/161  lung]
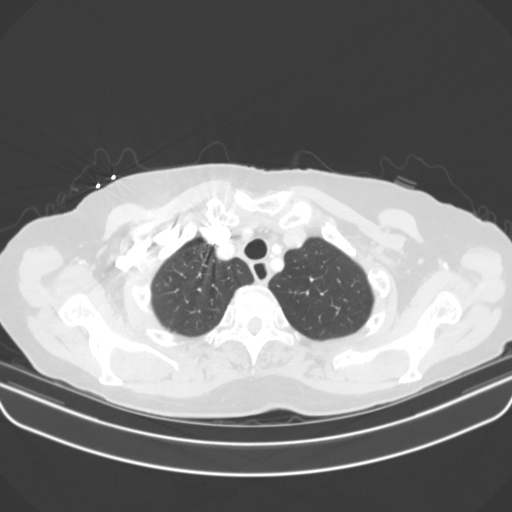
[im 149/161  lung]
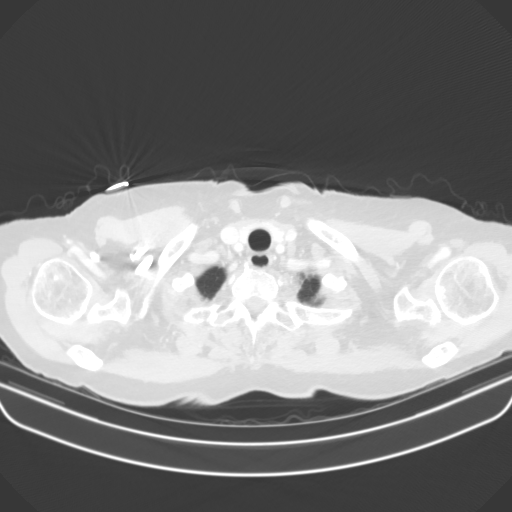

[Series 7: coronal · coronal · 0.63mm/px · 3 of 106 slices shown]
[im 22/106  lung]
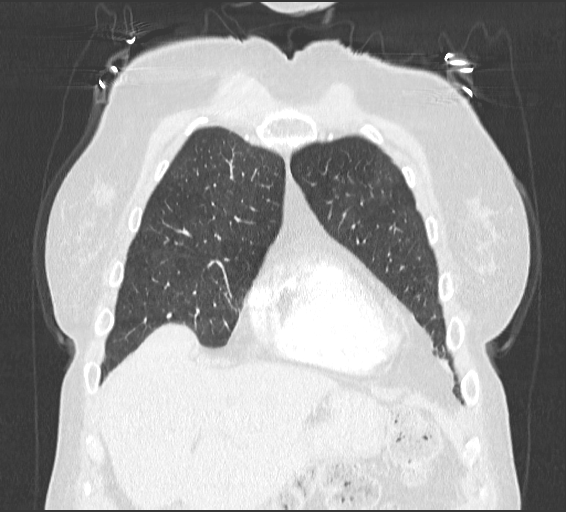
[im 43/106  lung]
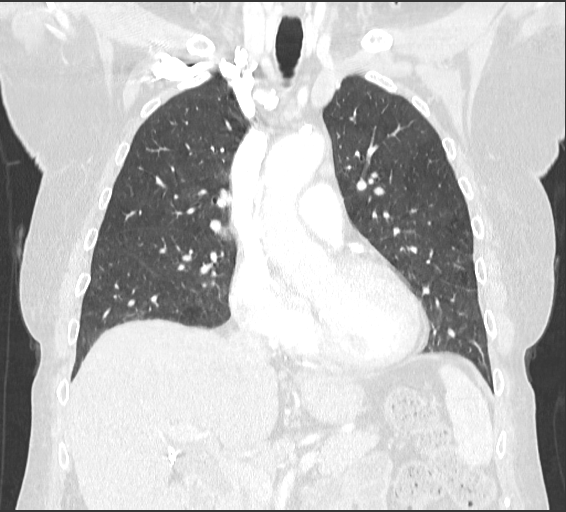
[im 64/106  lung]
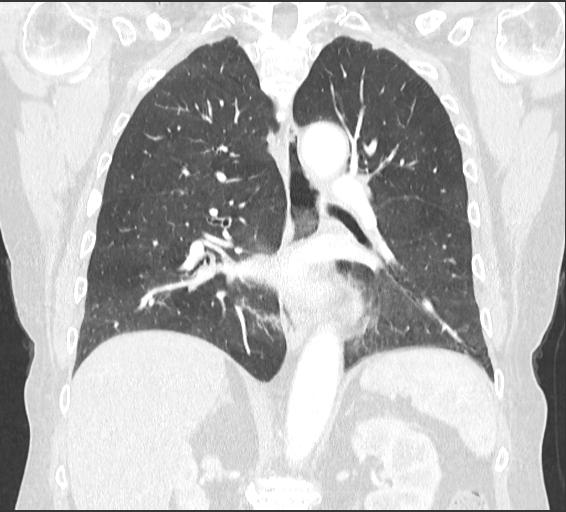

[15 of 36 positions shown; findings below may reference images not displayed]

FINDINGS: No prior exams are available for comparison. There is an outside 
report from a 3060 exam describing thickening of the distal esophagus. 
There is air throughout the esophagus with what appears to be debris in the 
esophagus. There is a small hiatal hernia. It is decompressed and have 
difficulty evaluating for bowel wall thickening. A dedicated barium swallow 
may 
better evaluate this region. There is a mosaic appearance to the lung 
parenchyma. This is nonspecific can be seen with small vessel or small airway 
disease. I do not see pulmonary embolic disease and most likely this is a 
small 
airway bronchiolitic process. There is some areas of bronchial wall thickening 
in both lower lobes concerning for bronchitis. There are some reticular nodule 
infiltrates posterior segment right upper lobe, lingula and medially in the 
right lower lobe also nonspecific for bronchiolitis. No pleural effusion or 
dominant mass identified. No adenopathy seen. The adrenal glands are normal in 
appearance.
IMPRESSION: Air and debris within the esophagus. Hiatal hernia. Consider barium swallow to 
better evaluate the GE junction. 
Suspect small airway disease as well as changes of underlying bronchiolitis. 
No 
dominant mass, effusion or adenopathy. Also bronchial wall thickening in both 
lower lobes consistent with changes of bronchitis. 
Atherosclerotic changes with marked coronary calcifications. 
RADIATION DOSE REDUCTION: All CT scans are performed using radiation dose 
reduction techniques, when applicable.  Technical factors are evaluated and 
adjusted to ensure appropriate moderation of exposure.  Automated dose 
management technology is applied to adjust the radiation doses to minimize 
exposure while achieving diagnostic quality images.

## 2018-04-11 ENCOUNTER — Other Ambulatory Visit (INDEPENDENT_AMBULATORY_CARE_PROVIDER_SITE_OTHER): Payer: Self-pay | Admitting: Family Medicine

## 2018-04-11 DIAGNOSIS — I272 Pulmonary hypertension, unspecified: Secondary | ICD-10-CM

## 2018-04-11 DIAGNOSIS — R5383 Other fatigue: Secondary | ICD-10-CM

## 2018-04-11 DIAGNOSIS — R5381 Other malaise: Secondary | ICD-10-CM

## 2018-04-11 DIAGNOSIS — E782 Mixed hyperlipidemia: Secondary | ICD-10-CM

## 2018-05-11 ENCOUNTER — Ambulatory Visit (INDEPENDENT_AMBULATORY_CARE_PROVIDER_SITE_OTHER): Payer: Medicare Other

## 2018-05-11 ENCOUNTER — Other Ambulatory Visit: Payer: Medicare Other | Attending: Family Medicine | Admitting: Family Medicine

## 2018-05-11 DIAGNOSIS — E782 Mixed hyperlipidemia: Secondary | ICD-10-CM | POA: Insufficient documentation

## 2018-05-11 DIAGNOSIS — I272 Pulmonary hypertension, unspecified: Secondary | ICD-10-CM | POA: Insufficient documentation

## 2018-05-11 DIAGNOSIS — R5383 Other fatigue: Secondary | ICD-10-CM

## 2018-05-11 DIAGNOSIS — R5381 Other malaise: Secondary | ICD-10-CM | POA: Insufficient documentation

## 2018-05-11 LAB — COMPREHENSIVE METABOLIC PNL, FASTING
ALBUMIN: 3.6 g/dL (ref 3.2–4.6)
ALKALINE PHOSPHATASE: 90 U/L (ref 20–130)
ALT (SGPT): 19 U/L (ref <=52)
ANION GAP: 7 mmol/L
AST (SGOT): 22 U/L (ref <=35)
BILIRUBIN TOTAL: 0.4 mg/dL (ref 0.3–1.2)
BUN/CREA RATIO: 15
BUN: 12 mg/dL (ref 10–25)
CALCIUM: 9.3 mg/dL (ref 8.8–10.3)
CHLORIDE: 109 mmol/L (ref 98–111)
CO2 TOTAL: 27 mmol/L (ref 21–35)
CREATININE: 0.79 mg/dL (ref <=1.30)
ESTIMATED GFR: >60 mL/min/1.73mˆ2
GLUCOSE: 109 mg/dL (ref 70–110)
POTASSIUM: 4.4 mmol/L (ref 3.5–5.0)
PROTEIN TOTAL: 6.6 g/dL (ref 6.0–8.3)
SODIUM: 143 mmol/L (ref 135–145)

## 2018-05-11 LAB — CBC WITH DIFF
BASOPHIL #: 0.1 x10ˆ3/uL (ref 0.00–0.20)
BASOPHIL %: 1 %
EOSINOPHIL #: 0.3 x10ˆ3/uL (ref 0.00–0.50)
EOSINOPHIL %: 4 %
HCT: 39.4 % (ref 34.6–46.2)
HGB: 13.1 g/dL (ref 11.8–15.8)
LYMPHOCYTE #: 2 x10ˆ3/uL (ref 0.90–3.40)
LYMPHOCYTE %: 25 %
MCH: 29.2 pg (ref 27.6–33.2)
MCHC: 33.3 g/dL (ref 32.6–35.4)
MCV: 87.9 fL (ref 82.3–96.7)
MONOCYTE #: 0.7 x10ˆ3/uL (ref 0.20–0.90)
MONOCYTE %: 9 %
MPV: 7.6 fL (ref 6.6–10.2)
NEUTROPHIL #: 4.8 x10ˆ3/uL (ref 1.50–6.40)
NEUTROPHIL %: 61 %
PLATELETS: 410 x10ˆ3/uL (ref 140–440)
RBC: 4.48 x10ˆ6/uL (ref 3.80–5.24)
RDW: 14.1 % (ref 12.4–15.2)
WBC: 7.9 x10ˆ3/uL (ref 3.5–10.3)

## 2018-05-11 LAB — LIPID PANEL
CHOLESTEROL: 160 mg/dL (ref 0–199)
HDL CHOL: 50 mg/dL (ref >40)
LDL DIRECT: 98 mg/dL (ref 0–99)
TRIGLYCERIDES: 86 mg/dL (ref 0–199)
VLDL CALC: 17 mg/dL (ref 0–50)

## 2018-05-11 LAB — THYROID STIMULATING HORMONE (SENSITIVE TSH): TSH: 5.363 u[IU]/mL — ABNORMAL HIGH (ref 0.450–5.330)

## 2018-05-15 NOTE — Progress Notes (Signed)
[facility] Maudie Mercury, MMOB  [address]  [place] [address]       Name: Amanda Zhang MRN:  [id number]   Date: 05/17/2018 Age: 72 y.o.         ICD-10-CM    1. Well adult exam Z00.00    2. BMI 26.0-26.9,adult Z68.26    3. 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 Borderline abnormal TFTs R94.6 TSH Sensitive       Nursing notes reviewed.             HPI:  The primary encounter diagnosis was Well adult exam. Diagnoses of BMI 26.0-26.9,adult, Age-related osteoporosis without current pathological fracture, and Borderline abnormal TFTs were also pertinent to this visit.   Reason for visit  Medicare Annual                   Nursing Notes:   Sherran Needs, RN  05/17/18 1326  Signed  Order for shingles injection  Larey Seat in October 2018 and fell on right hip---  Larey Seat on left hip few weeks ago---no injury  Discuss labs    Sherran Needs, RN  05/17/18 1310  Signed     05/17/18 1300   Depression Screen   Little interest or pleasure in doing things. 0   Feeling down, depressed, or hopeless 0   PHQ 2 Total 0       Sherran Needs, RN  05/17/18 1317  Signed     05/17/18 1300   Medicare Wellness Assessment   Medicare initial or wellness physical in the last year? No   Advance Directives   Does patient have a living will or MPOA YES   Has patient provided [employer] with a copy? no   Advance directive information given to the patient today? no   Activities of Daily Living   Do you need help with dressing, bathing, or walking? No   Do you need help with shopping, housekeeping, medications, or finances? No   Do you have rugs in hallways, broken steps, or poor lighting? No   Do you have grab bars in your bathroom, non-slip strips in your tub, and hand rails on your stairs? No   Urinary Incontinence Screen-Women only   Do you ever leak urine when you don't want to? No   Cognitive Function Screen   What is you age? 1   What is the time to the nearest hour? 1   What is the year? 1   What is the  name of this clinic? 1   Can the patient recognize two persons (the doctor, the nurse, home help, etc.)? 1   What is the date of your birth? (day and month sufficient)  1   In what year did World War II end? 1   Who is the current president of the [place]? 1   Count from 20 down to 1? 1   What address did I give you earlier? 1   Total Score 10   Depression Screen   Little interest or pleasure in doing things. 0   Feeling down, depressed, or hopeless 0   PHQ 2 Total 0   Hearing Screen   Have you noticed any hearing difficulties? No   After whispering 9-1-6 how many numbers did the patient repeat correctly? 3   Fall Risk Assessment   Do you feel unsteady when standing or walking? No   Do you worry about falling? No   Have  you fallen in the past year? Yes   How many times have you fallen? 2 or more times   Were you ever injured from falling? No   Vision Screen   Right Eye = 20 20   Left Eye = 20 20        Patient comes in today for annual physical exam.  She has fallen on to her right hip 3 times in the last year and left hip once.  She continues to have pain in the right hip particularly when she 1st gets up it is tender and stiff.  It is tender when she tries to lay on that side.  However for the last 361/365 days she has walk more than 10,000 steps per day without any difficulty.  No problem with the left hip at this time.  While she was in [place] she caught a virus that causes her to cough for quite a long time.  She was evaluated with chest x-ray x2 which showed a hilar infiltrate.  It did not clear so she was followed up with a CT scan which was basically normal.  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  She had dilation of the esophagus while they were doing that procedure.  She also had a stress test which was normal.      No problems with driving no incident or accident vision okay response time ok and navigating without  difficulty    Appetite good  no recent falls  sleep acceptable/okay   bowels moving regular without sign of bleeding the  voiding okay  up at night- to void 1  times          Outpatient Medications Prior to Visit:  alendronate (FOSAMAX) 70 mg Oral Tablet Take 1 Tab (70 mg total) by mouth Every 7 days 1st am,empty stomach,  full glass of water, nothing by mouth & no reclining for 30 min     No facility-administered medications prior to visit.   Social History     Socioeconomic History   . Marital status: Married     Spouse name: Not on file   . Number of children: Not on file   . Years of education: Not on file   . Highest education level: Not on file   Tobacco Use   . Smoking status: Former Games developer   . Smokeless tobacco: Never Used   . Tobacco comment: quit 1990   Substance and Sexual Activity   . Alcohol use: Yes     Frequency: 2-3 times a week     Comment: rarely   . Drug use: No   . Sexual activity: Not Currently     Partners: Male     family history includes Aortic Aneurysm in her mother; Parkinsons Disease in her father.    Past medical history reviewed with patient.    Review of Systems   Constitutional: Negative for activity change, appetite change, chills, diaphoresis, fever and unexpected weight change.   HENT: Negative for congestion, dental problem, drooling, ear discharge, facial swelling, hearing loss, mouth sores, nosebleeds, sinus pressure, sneezing, sore throat, tinnitus, trouble swallowing and voice change.    Eyes: Negative for pain, discharge, redness, itching and visual disturbance.   Respiratory: Negative for choking, chest tightness, shortness of breath and wheezing.    Cardiovascular: Negative for chest pain, palpitations and leg swelling.   Gastrointestinal: Negative for abdominal pain,  anal bleeding, blood in stool, constipation, diarrhea, nausea and rectal pain.   Endocrine: Negative for cold intolerance, heat intolerance, polydipsia, polyphagia and polyuria.   Genitourinary: Negative for  flank pain, frequency, hematuria, pelvic pain, urgency, vaginal bleeding, vaginal discharge and vaginal pain.   Musculoskeletal: Negative for back pain, gait problem and joint swelling.   Skin: Negative for wound.   Allergic/Immunologic: Negative for immunocompromised state.   Neurological: Negative for dizziness, tremors, seizures, syncope, facial asymmetry, speech difficulty, light-headedness and headaches.   Hematological: Negative for adenopathy. Does not bruise/bleed easily.   Psychiatric/Behavioral: Negative for confusion, decreased concentration and self-injury.         BP 122/62   Pulse 65   Ht 1.676 m (5\' 6" )   Wt 68.5 kg (151 lb)   SpO2 90%   BMI 24.37 kg/m         Physical Exam   Constitutional: She is oriented to person, place, and time. No distress.   HENT:   Head: Normocephalic and atraumatic.   Right Ear: External ear normal.   Left Ear: External ear normal.   Nose: Nose normal.   Mouth/Throat: No oropharyngeal exudate.   Eyes: Pupils are equal, round, and reactive to light. Conjunctivae and EOM are normal. Right eye exhibits no discharge. No scleral icterus.   Neck: No JVD present. No tracheal deviation present. No thyromegaly present.   Cardiovascular: Normal rate, regular rhythm and intact distal pulses. Exam reveals no gallop and no friction rub.   No murmur heard.  Pulmonary/Chest: Breath sounds normal. No stridor. No respiratory distress. She has no wheezes. She has no rales. She exhibits no tenderness.   Abdominal: Soft. Bowel sounds are normal. She exhibits no distension and no mass. There is no tenderness. There is no rebound and no guarding.   Musculoskeletal: She exhibits no edema, tenderness or deformity.   Lymphadenopathy:     She has no cervical adenopathy.   Neurological: She is alert and oriented to person, place, and time. She displays normal reflexes. No cranial nerve deficit. She exhibits normal muscle tone. Coordination normal.   Skin: Skin is warm and dry. No rash noted. She  is not diaphoretic. No erythema.   Psychiatric: She has a normal mood and affect. Her behavior is normal. Judgment and thought content normal.   Nursing note and vitals reviewed.      Clinical Support on 05/11/2018   Component Date Value Ref Range Status   . SODIUM 05/11/2018 143  135 - 145 mmol/L Final   . POTASSIUM 05/11/2018 4.4  3.5 - 5.0 mmol/L Final   . CHLORIDE 05/11/2018 109  98 - 111 mmol/L Final   . CO2 TOTAL 05/11/2018 27  21 - 35 mmol/L Final   . ANION GAP 05/11/2018 7  mmol/L Final   . BUN 05/11/2018 12  10 - 25 mg/dL Final   . CREATININE 16/08/9603 0.79  <=1.30 mg/dL Final   . BUN/CREA RATIO 05/11/2018 15   Final   . ESTIMATED GFR 05/11/2018 >60  Avg: 75 mL/min/1.56m^2 Final   . ALBUMIN 05/11/2018 3.6  3.2 - 4.6 g/dL Final   . CALCIUM 54/07/8118 9.3  8.8 - 10.3 mg/dL Final   . GLUCOSE 14/78/2956 109  70 - 110 mg/dL Final   . ALKALINE PHOSPHATASE 05/11/2018 90  20 - 130 U/L Final   . ALT (SGPT) 05/11/2018 19  <=52 U/L Final   . AST (SGOT) 05/11/2018 22  <=35 U/L Final   . BILIRUBIN TOTAL 05/11/2018 0.4  0.3 - 1.2 mg/dL Final   . PROTEIN TOTAL 05/11/2018 6.6  6.0 - 8.3 g/dL Final   . HDL CHOL 29/56/213006/19/2019 50  >40 mg/dL Final   . TRIGLYCERIDES 05/11/2018 86  0 - 199 mg/dL Final   . VLDL CALC 86/57/846906/19/2019 17  0 - 50 mg/dL Final   . CHOLESTEROL 62/95/284106/19/2019 160  0 - 199 mg/dL Final   . LDL DIRECT 32/44/010206/19/2019 98  0 - 99 mg/dL Final   . TSH 72/53/664406/19/2019 5.363* 0.450 - 5.330 uIU/mL Final   . WBC 05/11/2018 7.9  3.5 - 10.3 x10^3/uL Final   . RBC 05/11/2018 4.48  3.80 - 5.24 x10^6/uL Final   . HGB 05/11/2018 13.1  11.8 - 15.8 g/dL Final   . HCT 03/47/425906/19/2019 39.4  34.6 - 46.2 % Final   . MCV 05/11/2018 87.9  82.3 - 96.7 fL Final   . MCH 05/11/2018 29.2  27.6 - 33.2 pg Final   . MCHC 05/11/2018 33.3  32.6 - 35.4 g/dL Final   . RDW 56/38/756406/19/2019 14.1  12.4 - 15.2 % Final   . PLATELETS 05/11/2018 410  140 - 440 x10^3/uL Final   . MPV 05/11/2018 7.6  6.6 - 10.2 fL Final   . NEUTROPHIL % 05/11/2018 61  % Final   . LYMPHOCYTE %  05/11/2018 25  % Final   . MONOCYTE % 05/11/2018 9  % Final   . EOSINOPHIL % 05/11/2018 4  % Final   . BASOPHIL % 05/11/2018 1  % Final   . NEUTROPHIL # 05/11/2018 4.80  1.50 - 6.40 x10^3/uL Final   . LYMPHOCYTE # 05/11/2018 2.00  0.90 - 3.40 x10^3/uL Final   . MONOCYTE # 05/11/2018 0.70  0.20 - 0.90 x10^3/uL Final   . EOSINOPHIL # 05/11/2018 0.30  0.00 - 0.50 x10^3/uL Final   . BASOPHIL # 05/11/2018 0.10  0.00 - 0.20 x10^3/uL Final         Interval history reviewed with patient.  EGD stress test chest x-rays echo CT chest    Available laboratory studies, diagnostic studies and physician reports reviewed.  Labs are all good except there was a slight elevation or TSH.  I recommend she recheck that in 1-2 months.    The 10-year ASCVD risk score Denman George(Goff DC Montez HagemanJr., et al., 2013) is: 10%    Values used to calculate the score:      Age: [age] years      Sex: Female      Is Non-Hispanic African American: Yes      Diabetic: No      Tobacco smoker: No      Systolic Blood Pressure: 122 mmHg      Is BP treated: No      HDL Cholesterol: 50 mg/dL      Total Cholesterol: 160 mg/dL    Plan:      PPI-95-JOICD-10-CM    1. Well adult exam Z00.00    2. BMI 26.0-26.9,adult Z68.26    3. 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 Borderline abnormal TFTs R94.6 TSH Sensitive         Exercise and diet plan reviewed with patient.  Keep active as tolerated  Check right hip x-ray if she continues to have trouble.        Heart healthy lifestyle-heart healthy diet guidelines can be found by reading about the Mediterranean diet and the dash diet.  They both recommend no fried food no fast foods and limiting your  red meat and full fatty dairy to only once or twice per month.  Limit sodium intake to take 2000 meq per day.  Try a no added salt and no added sugar diet.  Increase her intake of fresh fruits and healthy fats from nuts and olive oil.  Get regular exercise 5-7 days per week-30-60 minute work outs.  Be active, try not to  sit more than 30 minutes at a time.  It 7-8 hours of sleep per night.  Decrease or discontinue your nicotine consumption.      Return in about 1 year (around 05/18/2019).          Return to clinic or call if any increase in symptoms/problem.      Fall Risk Follow up plan of care: Discussed optimizing home safety      Lucy Antigua, MD

## 2018-05-17 ENCOUNTER — Encounter (INDEPENDENT_AMBULATORY_CARE_PROVIDER_SITE_OTHER): Payer: Self-pay | Admitting: Family Medicine

## 2018-05-17 ENCOUNTER — Other Ambulatory Visit (INDEPENDENT_AMBULATORY_CARE_PROVIDER_SITE_OTHER): Payer: Self-pay

## 2018-05-17 ENCOUNTER — Ambulatory Visit (INDEPENDENT_AMBULATORY_CARE_PROVIDER_SITE_OTHER): Payer: Medicare Other | Admitting: Family Medicine

## 2018-05-17 VITALS — BP 122/62 | HR 65 | Ht 66.0 in | Wt 151.0 lb

## 2018-05-17 DIAGNOSIS — M810 Age-related osteoporosis without current pathological fracture: Secondary | ICD-10-CM

## 2018-05-17 DIAGNOSIS — Z0000 Encounter for general adult medical examination without abnormal findings: Secondary | ICD-10-CM

## 2018-05-17 DIAGNOSIS — R946 Abnormal results of thyroid function studies: Secondary | ICD-10-CM

## 2018-05-17 DIAGNOSIS — Z6826 Body mass index (BMI) 26.0-26.9, adult: Secondary | ICD-10-CM

## 2018-05-17 NOTE — Nursing Note (Signed)
05/17/18 1300   Medicare Wellness Assessment   Medicare initial or wellness physical in the last year? No   Advance Directives   Does patient have a living will or MPOA YES   Has patient provided [employer] [employer] with a copy? no   Advance directive information given to the patient today? no   Activities of Daily Living   Do you need help with dressing, bathing, or walking? No   Do you need help with shopping, housekeeping, medications, or finances? No   Do you have rugs in hallways, broken steps, or poor lighting? No   Do you have grab bars in your bathroom, non-slip strips in your tub, and hand rails on your stairs? No   Urinary Incontinence Screen-Women only   Do you ever leak urine when you don't want to? No   Cognitive Function Screen   What is you age? 1   What is the time to the nearest hour? 1   What is the year? 1   What is the name of this clinic? 1   Can the patient recognize two persons (the doctor, the nurse, home help, etc.)? 1   What is the date of your birth? (day and month sufficient)  1   In what year did World War II end? 1   Who is the current president of the [place]? 1   Count from 20 down to 1? 1   What address did I give you earlier? 1   Total Score 10   Depression Screen   Little interest or pleasure in doing things. 0   Feeling down, depressed, or hopeless 0   PHQ 2 Total 0   Hearing Screen   Have you noticed any hearing difficulties? No   After whispering 9-1-6 how many numbers did the patient repeat correctly? 3   Fall Risk Assessment   Do you feel unsteady when standing or walking? No   Do you worry about falling? No   Have you fallen in the past year? Yes   How many times have you fallen? 2 or more times   Were you ever injured from falling? No   Vision Screen   Right Eye = 20 20   Left Eye = 20 20

## 2018-05-17 NOTE — Nursing Note (Signed)
Order for shingles injection  Larey SeatFell in October 2018 and fell on right hip---  Larey SeatFell on left hip few weeks ago---no injury  Discuss labs

## 2018-05-17 NOTE — Patient Instructions (Addendum)
Medicare Preventive Services  Medicare coverage information Recommendation for YOU   Heart Disease and Diabetes   Lipid profile every 5 years or more often if at risk for cardiovascular disease  Last Lipid Panel  (Last result in the past 2 years)      Cholesterol   HDL   LDL   Direct LDL   Triglycerides      05/11/18 0932 160 50   98 86         Diabetes Screening  yearly for those at risk for diabetes, 2 tests per year for those with prediabetes Last Glucose: 109    Diabetes Self Management Training or Medical Nutrition Therapy  for those with diabetes, up to 10 hrs initial training within a year, subsequent years up to 2 hrs of follow up training Optional for those with diabetes     Medical Nutrition Therapy three hours of one-on-one counseling in first year, two hours in subsequent years Optional for those with diabetes, kidney disease   Intensive Behavioral Therapy for Obesity  Face-to-face counseling, first month every week, month 2-6 every other week, month 7-12 every month if continued progress is documented Optional for those with Body Mass Index 30 or higher  Your Body mass index is 24.37 kg/m.   Tobacco Cessation (Quitting) Counseling   two attempts per year, max 4 sessions per attempt, up to 8 per year, for those with tobacco-related health condition Optional for those that use tobacco   Cancer Screening   Colorectal screening   for anyone age 250 to 2275 or any age if high risk:  . Screening Colonoscopy every 10 yrs if low risk,  more frequent if higher risk  OR  . Flexible  Sigmoidoscopy  every 5 yr OR  . Fecal Occult Blood Testing yearly OR  . Cologuard Stool DNA test once every 3 years OR  . CT Colonography every 5 yrs    See your schedule below   Screening Pap Test recommended every 3 years for all women age 72 to 3465, or every five years if combined with HPV test (routine screening not needed after total hysterectomy).  Medicare covers every 2 years, up to yearly if high risk.  Screening Pelvic Exam  Medicare covers every 2 years, yearly if high risk or childbearing age with abnormal Pap in last 3 yrs. See your schedule below   Screening Mammogram   Recommended every 1-2 years for women age 72 to 6774, and selectively recommended for women between 3440-49 based on shared decisions about risk. Covered by Medicare up to every year for women age 940 or older See your schedule below   Lung Cancer Screening  Annual low dose computed tomography (LDCT scan) is recommended for those age 72-77 who smoked 30 pack-years and are current smokers or quit smoking within past 15 years (one pack-year= smoking one PPD for one year), after counseling by your doctor or nurse clinician about the possible benefits or harms See your schedule below   Vaccinations   Pneumococcal Vaccine recommended routinely age 5+ with two separate vaccines one year apart (Prevnar then Pneumovax).  Recommended before age 72 if medical conditions increase risk  Seasonal Influenza Vaccine once every flu season   Hepatitis B Vaccine 3 doses if risk (including anyone with diabetes or liver disease)  Shingles Vaccine Once or twice at age 72 or older  Diphtheria Tetanus Pertussis Vaccine ONCE as adult, booster every 10 years     Immunization History   Administered  Date(s) Administered   . Pneumovax 08/08/2013   . Prevnar 13 (Admin) 11/01/2014   . ZOSTAVAX (VARICELLA ZOSTER VACCINE (ADMIN) 12/08/2012     Shingles vaccine and Diphtheria Tetanus Pertussis vaccines are available at pharmacies or local health department without a prescription.   Other Screening   Bone Densitometry   every 24 months for anyone at risk, including postmenopausal       Glaucoma Screening   yearly if in high risk group such as diabetes, family history, African American age 51+ or Hispanic American age 78+      Hepatitis C Screening recommended ONCE for those born between 1945-1965, or high risk for HCV infection     HIV Testing   yearly or up to 3 times in pregnancy     Abdominal Aortic  Aneurysm Screening Ultrasound   once with Initial Welcome to Medicare Physical within 12 months of coverage if  65 + with family history of AAA       Your Personalized Schedule for Preventive Tests   Health Maintenance: Pending and Last Completed       Date Due Completion Date    Hepatitis C screening antibody 12/11/1990 ---    Shingles Vaccine (2 of 3) 02/02/2013 12/08/2012    Mammography 06/09/2017 06/10/2015    Depression Screening 05/12/2018 05/12/2017    Annual Wellness Exam 05/12/2018 05/12/2017    Influenza Vaccine (Season Ended) 07/24/2018 ---    Colonoscopy 09/11/2024 09/11/2014         Will get rx for shingles  Flu vac 09/14/2017  Clinical Support on 05/11/2018   Component Date Value Ref Range Status   . SODIUM 05/11/2018 143  135 - 145 mmol/L Final   . POTASSIUM 05/11/2018 4.4  3.5 - 5.0 mmol/L Final   . CHLORIDE 05/11/2018 109  98 - 111 mmol/L Final   . CO2 TOTAL 05/11/2018 27  21 - 35 mmol/L Final   . ANION GAP 05/11/2018 7  mmol/L Final   . BUN 05/11/2018 12  10 - 25 mg/dL Final   . CREATININE 40/98/1191 0.79  <=1.30 mg/dL Final   . BUN/CREA RATIO 05/11/2018 15   Final   . ESTIMATED GFR 05/11/2018 >60  Avg: 75 mL/min/1.60m^2 Final   . ALBUMIN 05/11/2018 3.6  3.2 - 4.6 g/dL Final   . CALCIUM 47/82/9562 9.3  8.8 - 10.3 mg/dL Final   . GLUCOSE 13/06/6577 109  70 - 110 mg/dL Final   . ALKALINE PHOSPHATASE 05/11/2018 90  20 - 130 U/L Final   . ALT (SGPT) 05/11/2018 19  <=52 U/L Final   . AST (SGOT) 05/11/2018 22  <=35 U/L Final   . BILIRUBIN TOTAL 05/11/2018 0.4  0.3 - 1.2 mg/dL Final   . PROTEIN TOTAL 05/11/2018 6.6  6.0 - 8.3 g/dL Final   . HDL CHOL 46/96/2952 50  >40 mg/dL Final   . TRIGLYCERIDES 05/11/2018 86  0 - 199 mg/dL Final   . VLDL CALC 84/13/2440 17  0 - 50 mg/dL Final   . CHOLESTEROL 09/19/2535 160  0 - 199 mg/dL Final   . LDL DIRECT 64/40/3474 98  0 - 99 mg/dL Final   . TSH 25/95/6387 5.363* 0.450 - 5.330 uIU/mL Final   . WBC 05/11/2018 7.9  3.5 - 10.3 x10^3/uL Final   . RBC 05/11/2018 4.48  3.80  - 5.24 x10^6/uL Final   . HGB 05/11/2018 13.1  11.8 - 15.8 g/dL Final   . HCT 56/43/3295 39.4  34.6 - 46.2 % Final   .  MCV 05/11/2018 87.9  82.3 - 96.7 fL Final   . MCH 05/11/2018 29.2  27.6 - 33.2 pg Final   . MCHC 05/11/2018 33.3  32.6 - 35.4 g/dL Final   . RDW 84/69/6295 14.1  12.4 - 15.2 % Final   . PLATELETS 05/11/2018 410  140 - 440 x10^3/uL Final   . MPV 05/11/2018 7.6  6.6 - 10.2 fL Final   . NEUTROPHIL % 05/11/2018 61  % Final   . LYMPHOCYTE % 05/11/2018 25  % Final   . MONOCYTE % 05/11/2018 9  % Final   . EOSINOPHIL % 05/11/2018 4  % Final   . BASOPHIL % 05/11/2018 1  % Final   . NEUTROPHIL # 05/11/2018 4.80  1.50 - 6.40 x10^3/uL Final   . LYMPHOCYTE # 05/11/2018 2.00  0.90 - 3.40 x10^3/uL Final   . MONOCYTE # 05/11/2018 0.70  0.20 - 0.90 x10^3/uL Final   . EOSINOPHIL # 05/11/2018 0.30  0.00 - 0.50 x10^3/uL Final   . BASOPHIL # 05/11/2018 0.10  0.00 - 0.20 x10^3/uL Final

## 2018-05-17 NOTE — Nursing Note (Signed)
05/17/18 1300   Depression Screen   Little interest or pleasure in doing things. 0   Feeling down, depressed, or hopeless 0   PHQ 2 Total 0

## 2018-05-17 NOTE — Progress Notes (Signed)
[facility] Maudie Mercury, MMOB  [address]  [place] [address]    Medicare Annual Wellness Visit    Name: Amanda KOSSMAN MRN:  [id number]   Date: 05/17/2018 Age: 72 y.o.       SUBJECTIVE:   Amanda Zhang is a 72 y.o. female for presenting for Medicare Wellness exam.   I have reviewed and reconciled the medication list with the patient today.    I have reviewed and updated as appropriate the past medical, family and social history. 05/17/2018 as summarized below:  No past medical history on file.  Past Surgical History:   Procedure Laterality Date   . Colonoscopy w/ endoscopic fna/fnb  12/24/2010   . Replacement total knee  2005     No current outpatient medications on file.     Family Medical History:     Problem Relation (Age of Onset)    Aortic Aneurysm Mother    Parkinsons Disease Father            Social History     Socioeconomic History   . Marital status: Married     Spouse name: Not on file   . Number of children: Not on file   . Years of education: Not on file   . Highest education level: Not on file   Tobacco Use   . Smoking status: Former Games developer   . Smokeless tobacco: Never Used   . Tobacco comment: quit 1990   Substance and Sexual Activity   . Alcohol use: Yes     Frequency: 2-3 times a week     Comment: rarely   . Drug use: No   . Sexual activity: Not Currently     Partners: Male     Review of Systems: Pertinent items are noted in HPI.     List of Current Health Care Providers   Care Team     PCP     Name Type Specialty Phone Number    Lucy Antigua, MD Physician [facility] [phone]          Care Team     No care team found                  Health Maintenance   Topic Date Due   . Hepatitis C screening antibody  12/11/1990   . Shingles Vaccine (2 of 3) 02/02/2013   . Mammography  06/09/2017   . Depression Screening  05/12/2018   . Annual Wellness Exam  05/12/2018   . Influenza Vaccine (Season Ended) 07/24/2018   . Colonoscopy  09/11/2024   . Pneumococcal 65+ Years Low Risk   Completed   . Osteoporosis screening  Completed   . Adult Tdap-Td  Discontinued     Medicare Wellness Assessment   Medicare initial or wellness physical in the last year?: No  Advance Directives (optional)   Does patient have a living will or MPOA: YES   Has patient provided [employer] with a copy?: no   Advance directive information given to the patient today?: no      Activities of Daily Living   Do you need help with dressing, bathing, or walking?: No   Do you need help with shopping, housekeeping, medications, or finances?: No   Do you have rugs in hallways, broken steps, or poor lighting?: No   Do you have grab bars in your bathroom, non-slip strips in your tub, and hand rails on your stairs?: No   Urinary Incontinence  Screen (Women >=65 only)   Do you ever leak urine when you don't want to?: No   Cognitive Function Screen (1=Yes, 0=No)   What is you age?: Correct   What is the time to the nearest hour?: Correct   What is the year?: Correct   What is the name of this clinic?: Correct   Can the patient recognize two persons (the doctor, the nurse, home help, etc.)?: Correct   What is the date of your birth? (day and month sufficient) : Correct   In what year did World War II end?: Correct   Who is the current president of the [place]?: Correct   Count from 20 down to 1?: Correct   What address did I give you earlier?: Correct   Total Score: 10       Hearing Screen   Have you noticed any hearing difficulties?: No  After whispering 9-1-6 how many numbers did the patient repeat correctly?: 3   Fall Risk Screen   Do you feel unsteady when standing or walking?: No  Do you worry about falling?: No  Have you fallen in the past year?: Yes  How many times have you fallen?: 2 or more times  Were you ever injured from falling?: No   Vision Screen   Right Eye = 20: 20       Depression Screen   Little interest or pleasure in doing things.: Not at all  Feeling down, depressed, or hopeless: Not at all  PHQ 2 Total: 0         OBJECTIVE:   BP 122/62   Pulse 65   Ht 1.676 m (5\' 6" )   Wt 68.5 kg (151 lb)   SpO2 90%   BMI 24.37 kg/m        Other appropriate exam:    Health Maintenance Due   Topic Date Due   . Hepatitis C screening antibody  12/11/1990   . Shingles Vaccine (2 of 3) 02/02/2013   . Mammography  06/09/2017   . Depression Screening  05/12/2018   . Annual Wellness Exam  05/12/2018      ASSESSMENT & PLAN:  Problem List Items Addressed This Visit        Musculoskeletal    Age-related osteoporosis without current pathological fracture      Other Visit Diagnoses     Well adult exam    -  Primary    BMI 26.0-26.9,adult        Borderline abnormal TFTs        Relevant Orders    TSH Sensitive           Identified Risk Factors/ Recommended Actions       Fall Risk Follow up plan of care: Discussed optimizing home safety      Orders Placed This Encounter   . TSH Sensitive          The patient has been educated about risk factors and recommended preventive care. Written Prevention Plan completed/ updated and given to patient (see After Visit Summary).    Return in about 1 year (around 05/18/2019).    Lucy AntiguaFrances Yamato Kopf, MD  [facility]  [facility]  [address]  [place] [phone]  [phone]

## 2018-07-20 ENCOUNTER — Ambulatory Visit (INDEPENDENT_AMBULATORY_CARE_PROVIDER_SITE_OTHER): Payer: Medicare Other

## 2018-07-20 ENCOUNTER — Other Ambulatory Visit: Payer: Medicare Other | Attending: Family Medicine | Admitting: Family Medicine

## 2018-07-20 DIAGNOSIS — R946 Abnormal results of thyroid function studies: Principal | ICD-10-CM

## 2018-07-20 LAB — THYROID STIMULATING HORMONE (SENSITIVE TSH): TSH: 2.84 u[IU]/mL (ref 0.450–5.330)

## 2018-10-28 IMAGING — MG MAMMOGRAPHY SCREENING BILATERAL 3D TOMOSYNTHESIS WITH CAD
8 series · 9 of 24 positions shown · non-contrast
Comparison: Comparison was made to prior exams.

MAMMOGRAPHY SCREENING BILATERAL 3D TOMOSYNTHESIS WITH CAD, 10/28/2018 [DATE]: 
CLINICAL INDICATION: Screening exam.
TECHNIQUE: Digital bilateral mammograms and 3-D Tomosynthesis were obtained. 
These were interpreted both primarily and with the aid of computer-aided 
detection system.

[L MLO]
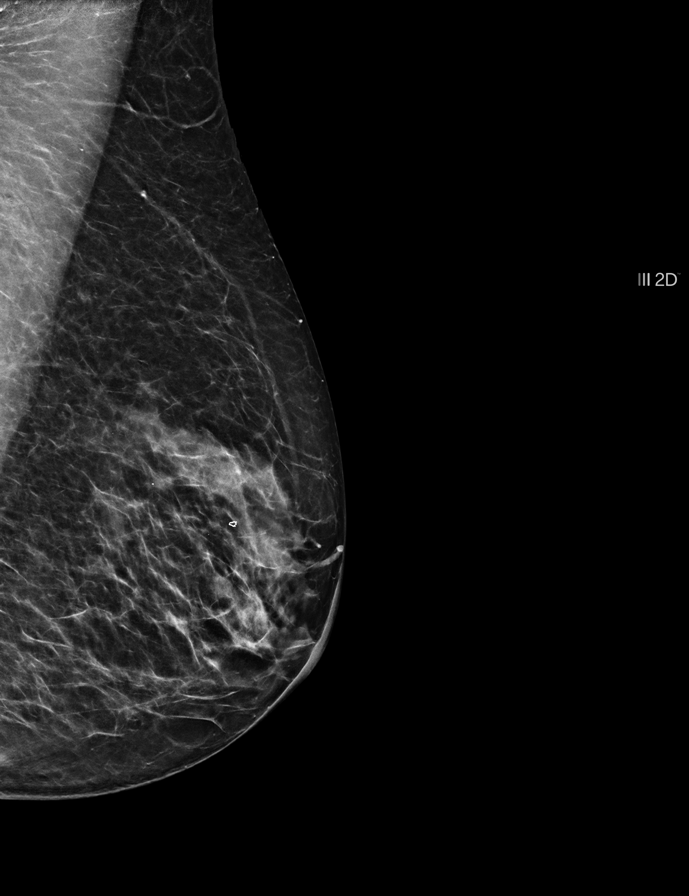

[L CC]
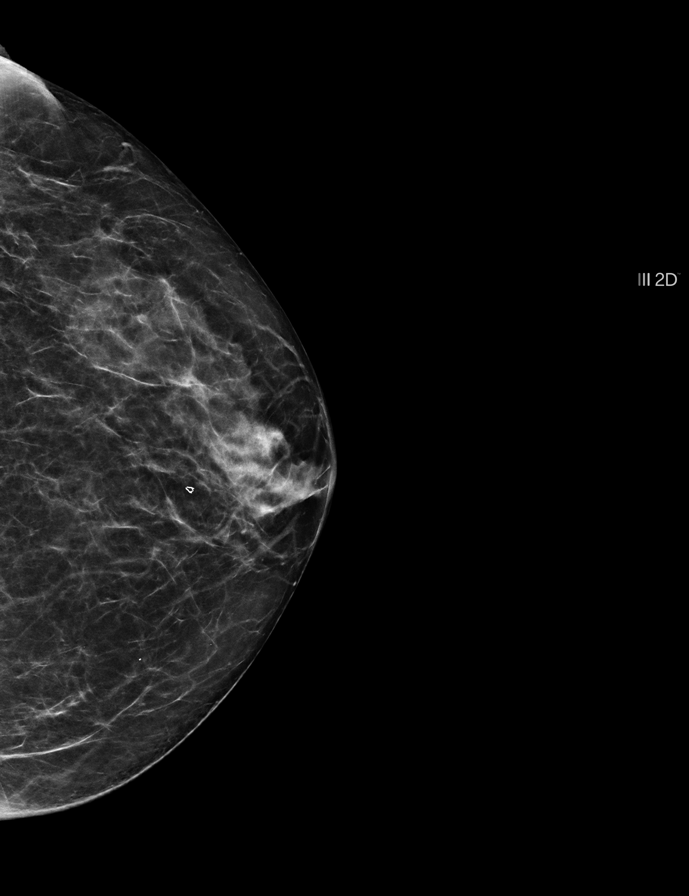

[R MLO]
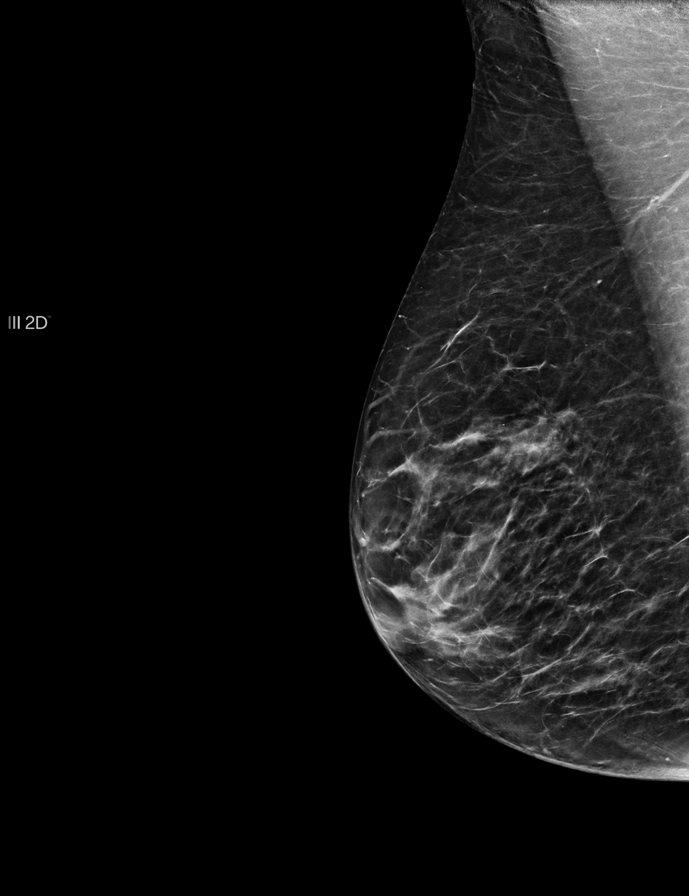

[R CC]
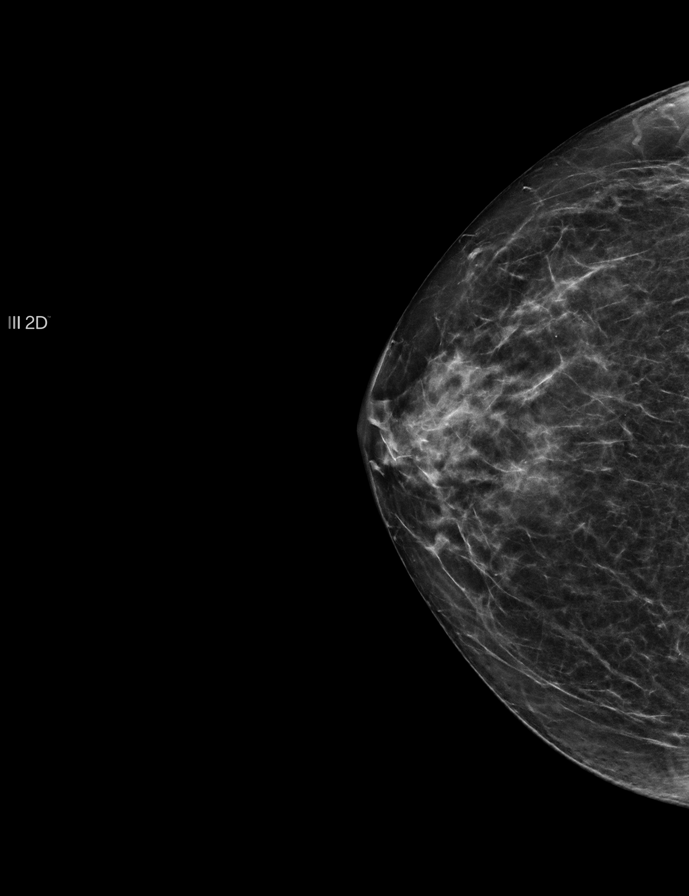

[L CC tomo · 2 of 58 frames shown]
[frame 19/58]
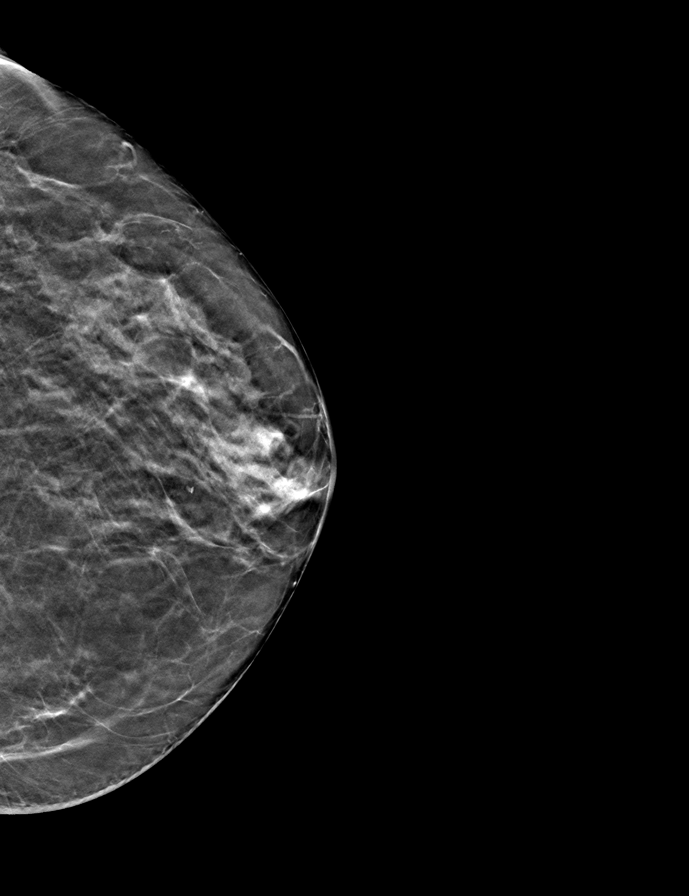
[frame 29/58]
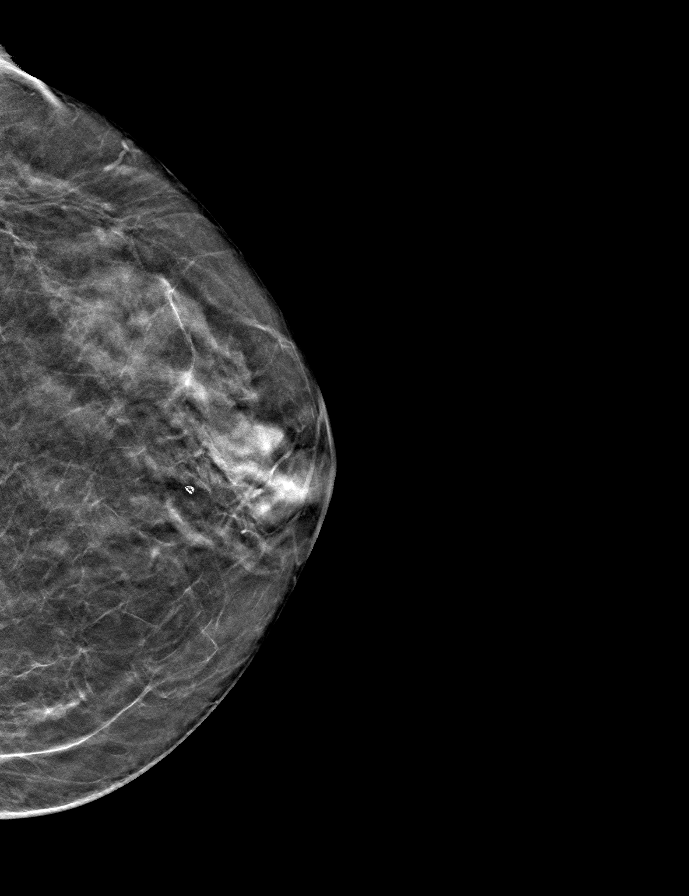

[R CC tomo · tomo slice 27/54.0]
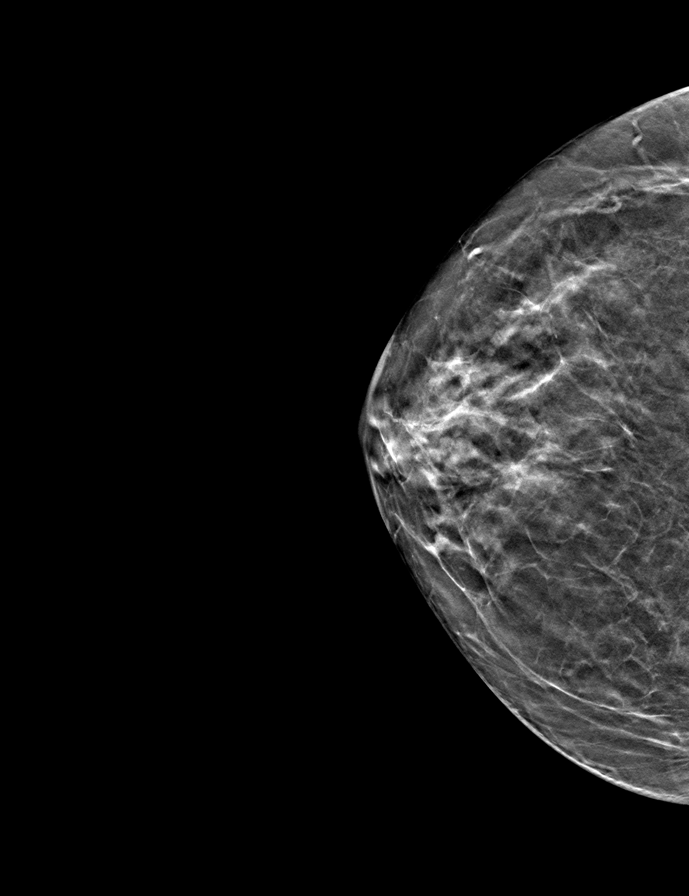

[L MLO tomo · tomo slice 32/63.0]
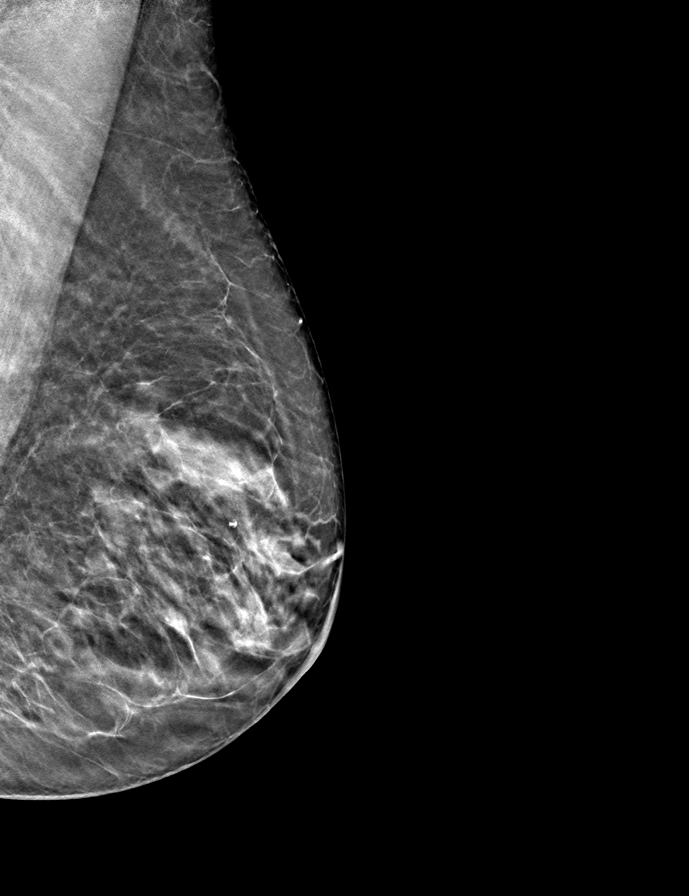

[R MLO tomo · tomo slice 30/59.0]
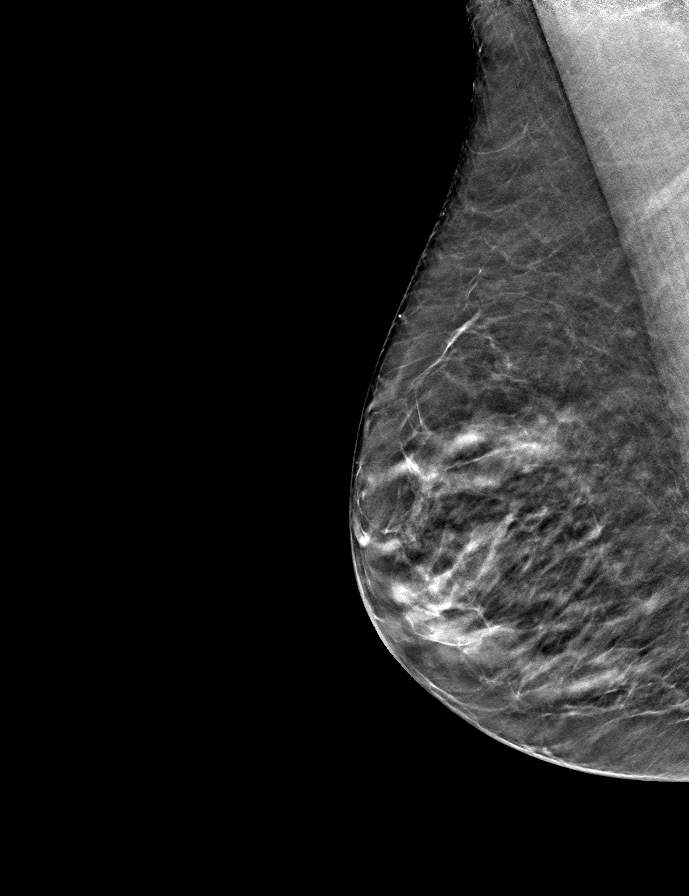

[9 of 24 positions shown; findings below may reference images not displayed]

BREAST DENSITY: (Level C) The breasts are heterogeneously dense, which may 
obscure small masses.
FINDINGS: Post procedure clip projects in the superior left breast.No suspicious 
mass, calcifications, or area of architectural distortion in either breast.
IMPRESSION: No mammographic evidence of malignancy in either breast. 
( BI-RADS 2) Benign findings. Routine mammographic follow-up is recommended.

## 2019-04-18 ENCOUNTER — Other Ambulatory Visit (INDEPENDENT_AMBULATORY_CARE_PROVIDER_SITE_OTHER): Payer: Self-pay | Admitting: Family Medicine

## 2019-04-18 DIAGNOSIS — E782 Mixed hyperlipidemia: Secondary | ICD-10-CM

## 2019-04-18 DIAGNOSIS — D508 Other iron deficiency anemias: Secondary | ICD-10-CM

## 2019-04-18 DIAGNOSIS — R739 Hyperglycemia, unspecified: Secondary | ICD-10-CM

## 2019-04-18 DIAGNOSIS — R5383 Other fatigue: Secondary | ICD-10-CM

## 2019-04-18 DIAGNOSIS — R718 Other abnormality of red blood cells: Secondary | ICD-10-CM

## 2019-05-22 ENCOUNTER — Encounter (INDEPENDENT_AMBULATORY_CARE_PROVIDER_SITE_OTHER): Payer: Self-pay | Admitting: Family Medicine

## 2019-07-03 LAB — BASIC METABOLIC PANEL
BUN: 17
CARBON DIOXIDE: 26
CHLORIDE: 111
CREATININE: 0.86
GLUCOSE,NONFAST: 83
POTASSIUM: 4.4
SODIUM: 141

## 2019-07-03 LAB — CBC/DIFF
HCT: 40.6
HGB: 12.4
MCH: 29.5
MCHC: 30.5
MCV: 96.4
PLATELET COUNT: 350
RBC: 4.21
WBC: 6.6

## 2019-07-03 LAB — LIPID PANEL
CHOLESTEROL: 179
HDL-CHOLESTEROL: 61
LDL (CALCULATED): 103
TRIGLYCERIDES: 77

## 2019-07-03 LAB — HEPATITIS C ANTIBODY: HCV ANTIBODY QUALITATIVE: NONREACTIVE

## 2019-08-04 ENCOUNTER — Other Ambulatory Visit: Payer: Self-pay

## 2019-10-24 ENCOUNTER — Other Ambulatory Visit: Payer: Self-pay

## 2019-10-30 IMAGING — MG MAMMOGRAPHY SCREENING BILATERAL 3D TOMOSYNTHESIS WITH CAD
8 series · 8 of 24 positions shown · non-contrast
Comparison: Comparison was made to prior exams.

MAMMOGRAPHY SCREENING BILATERAL 3D TOMOSYNTHESIS WITH CAD, 10/30/2019 [DATE]: 
CLINICAL INDICATION: Screening exam.
TECHNIQUE: Digital bilateral mammograms and 3-D Tomosynthesis were obtained. 
These were interpreted both primarily and with the aid of computer-aided 
detection system.

[R CC]
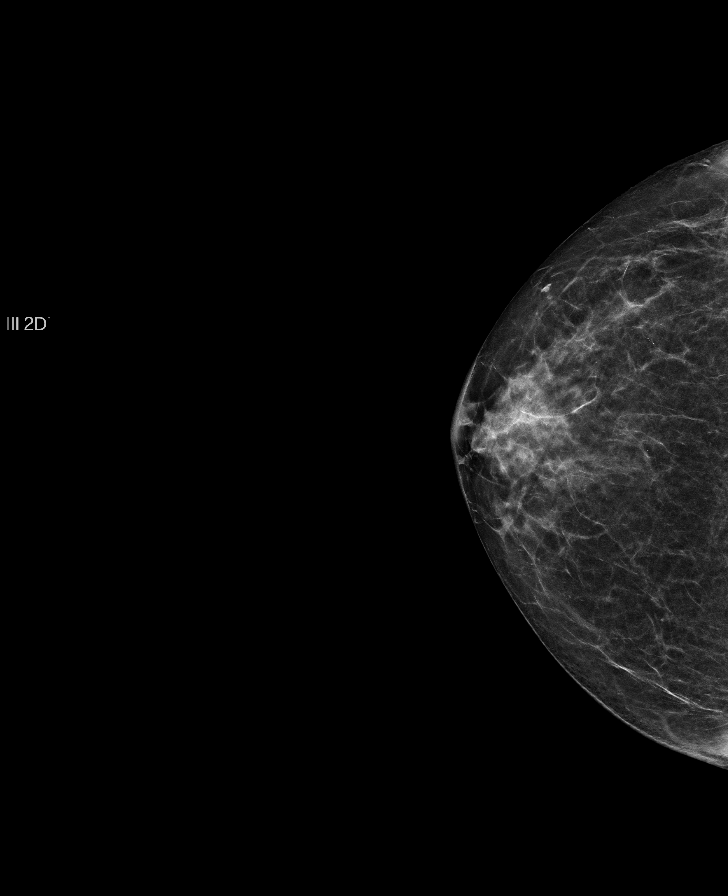

[R MLO]
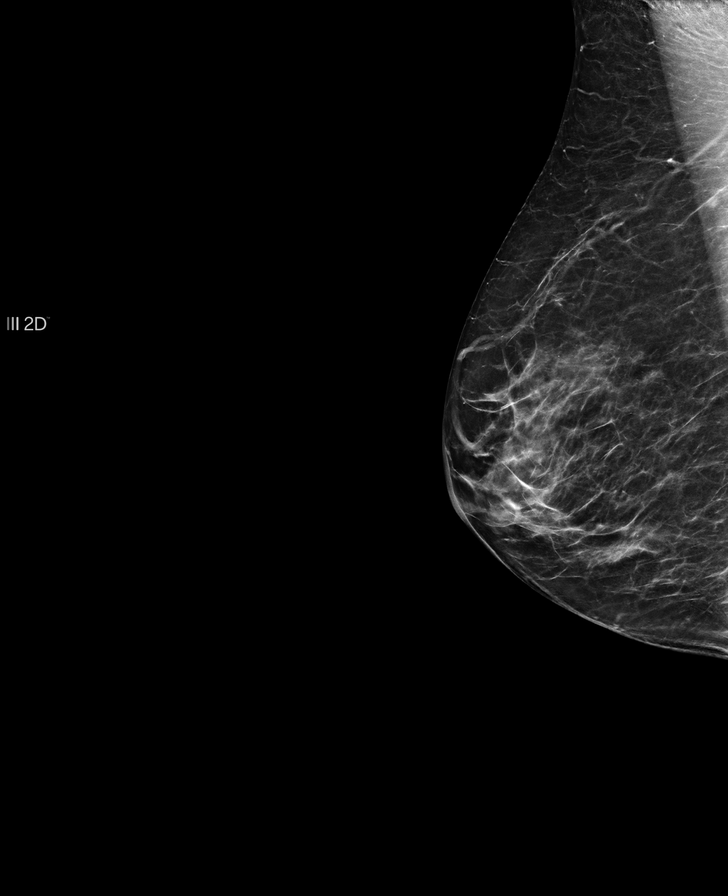

[L CC]
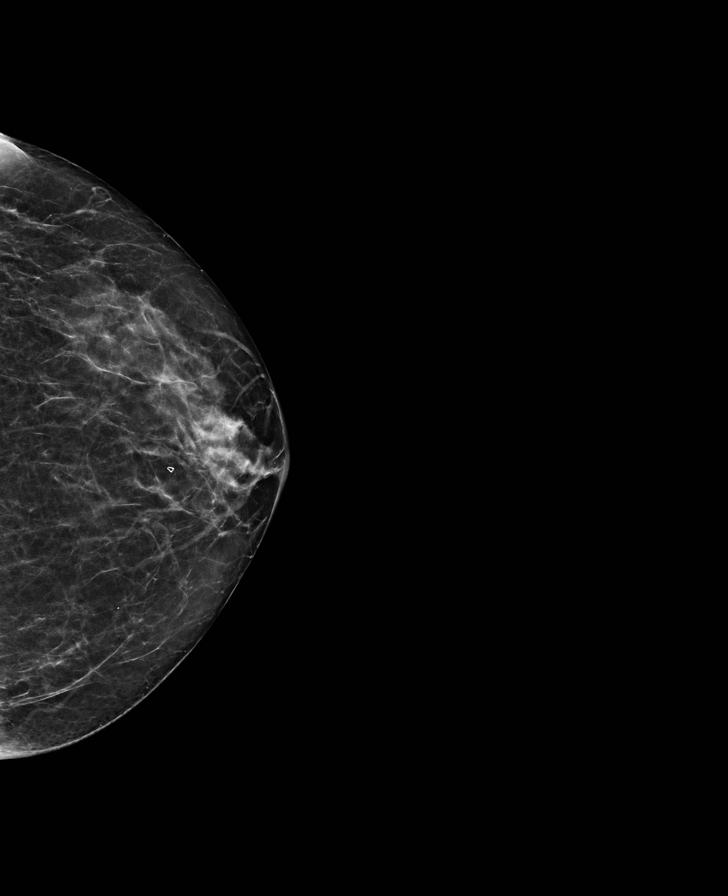

[L MLO]
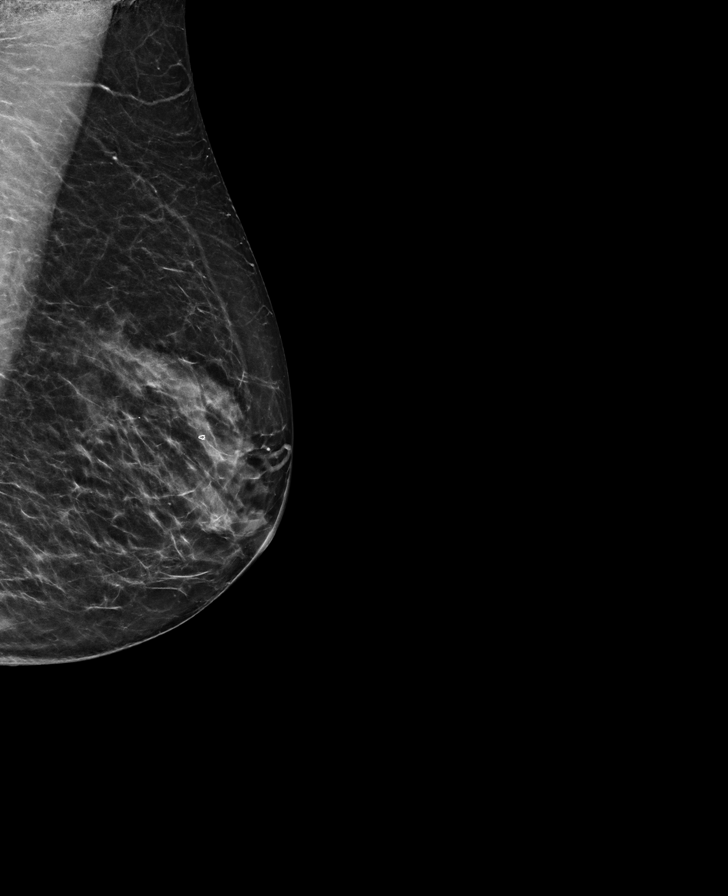

[R CC tomo · tomo slice 26/51.0]
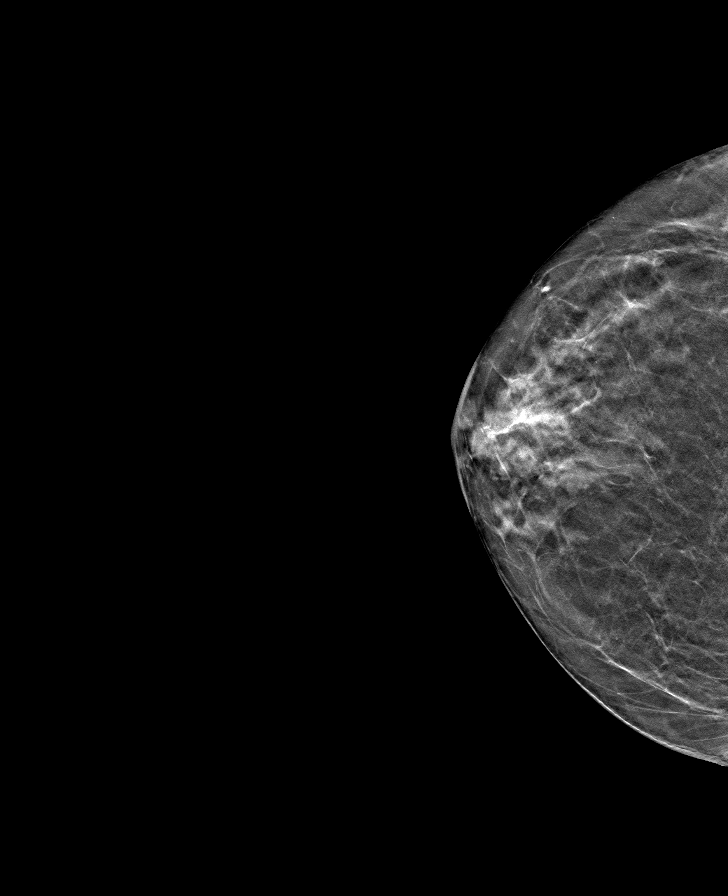

[L MLO tomo · tomo slice 31/60.0]
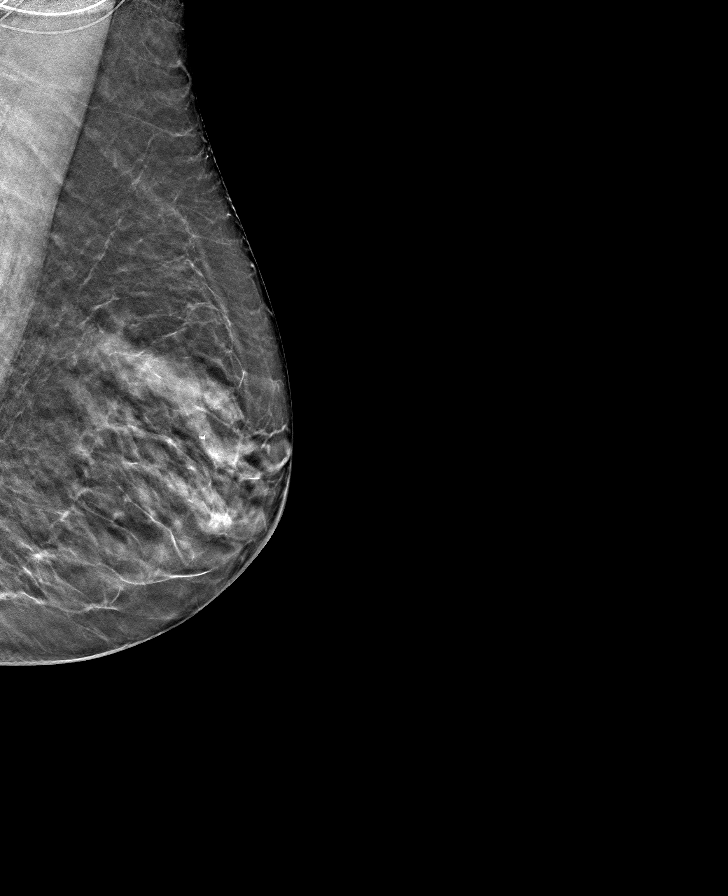

[L CC tomo · tomo slice 28/55.0]
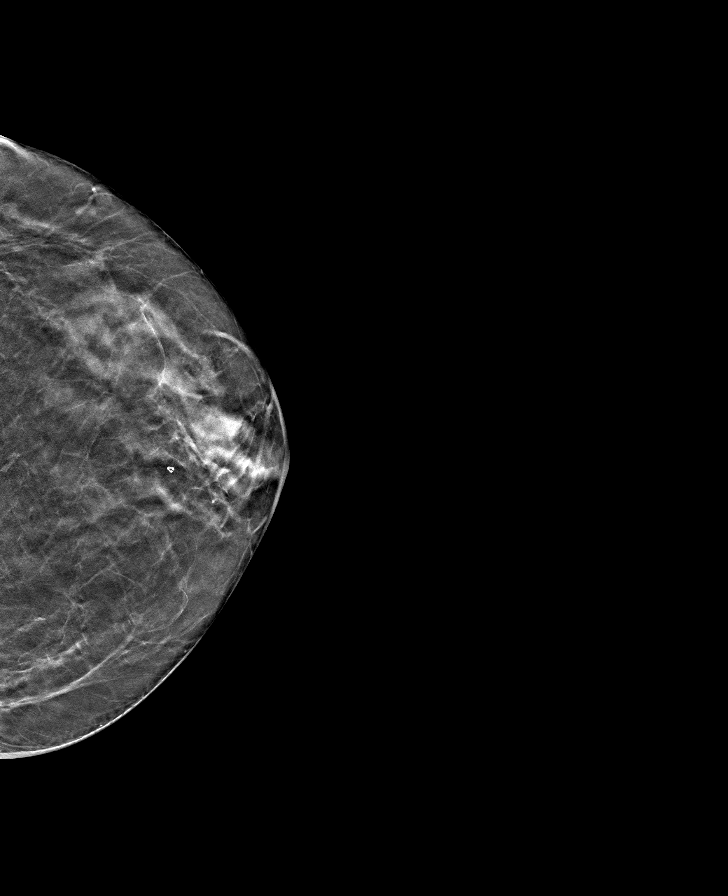

[R MLO tomo · tomo slice 32/63.0]
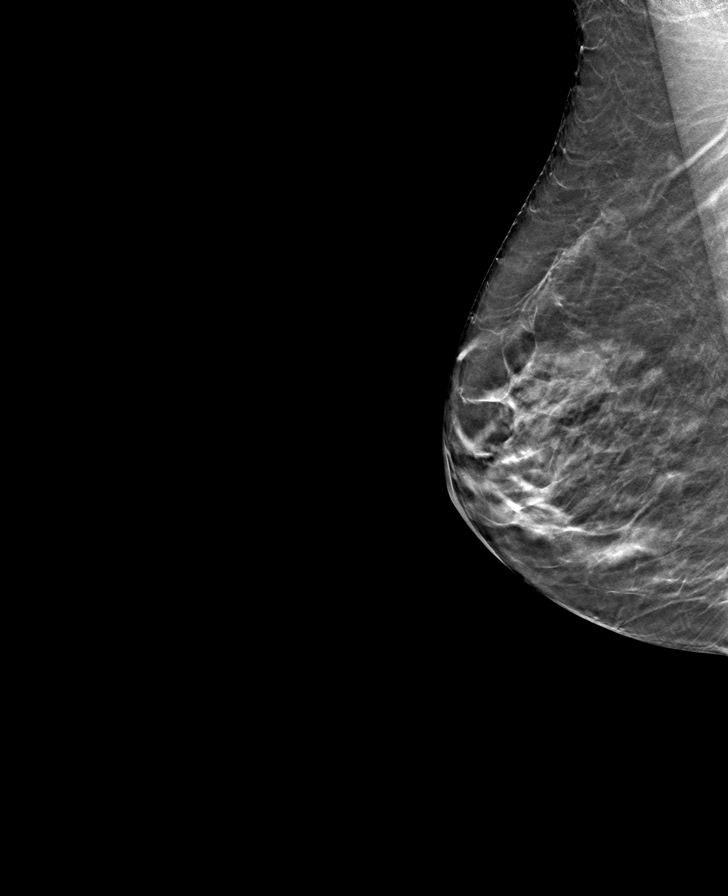

[8 of 24 positions shown; findings below may reference images not displayed]

BREAST DENSITY: (Level C) The breasts are heterogeneously dense, which may 
obscure small masses.
FINDINGS: There is a post procedure clip in the superior left breast.No 
suspicious mass, calcifications, or area of architectural distortion in either 
breast.
IMPRESSION: No mammographic evidence of malignancy in either breast. 
( BI-RADS 2) Benign findings. Routine mammographic follow-up is recommended.

## 2019-11-27 ENCOUNTER — Other Ambulatory Visit (INDEPENDENT_AMBULATORY_CARE_PROVIDER_SITE_OTHER): Payer: Self-pay

## 2019-11-28 ENCOUNTER — Encounter (INDEPENDENT_AMBULATORY_CARE_PROVIDER_SITE_OTHER): Payer: Self-pay | Admitting: Family Medicine

## 2019-11-28 ENCOUNTER — Other Ambulatory Visit (INDEPENDENT_AMBULATORY_CARE_PROVIDER_SITE_OTHER): Payer: Self-pay

## 2019-11-28 DIAGNOSIS — E782 Mixed hyperlipidemia: Secondary | ICD-10-CM

## 2019-11-28 DIAGNOSIS — R718 Other abnormality of red blood cells: Secondary | ICD-10-CM

## 2019-11-28 DIAGNOSIS — D508 Other iron deficiency anemias: Secondary | ICD-10-CM

## 2019-11-28 DIAGNOSIS — R739 Hyperglycemia, unspecified: Secondary | ICD-10-CM

## 2020-10-30 IMAGING — MG MAMMOGRAPHY SCREENING BILATERAL 3D TOMOSYNTHESIS WITH CAD
8 series · 8 of 24 positions shown · non-contrast
Comparison: 10/30/2019 dating back to 04/20/2013 
BREAST DENSITY: (Level B) There are scattered areas of fibroglandular density.

MAMMOGRAPHY SCREENING BILATERAL 3D TOMOSYNTHESIS WITH CAD, 10/30/2020 [DATE]: 
CLINICAL INDICATION: Screening exam.
TECHNIQUE: Digital bilateral mammograms and 3-D Tomosynthesis were obtained. 
These were interpreted both primarily and with the aid of computer-aided 
detection system.

[R MLO]
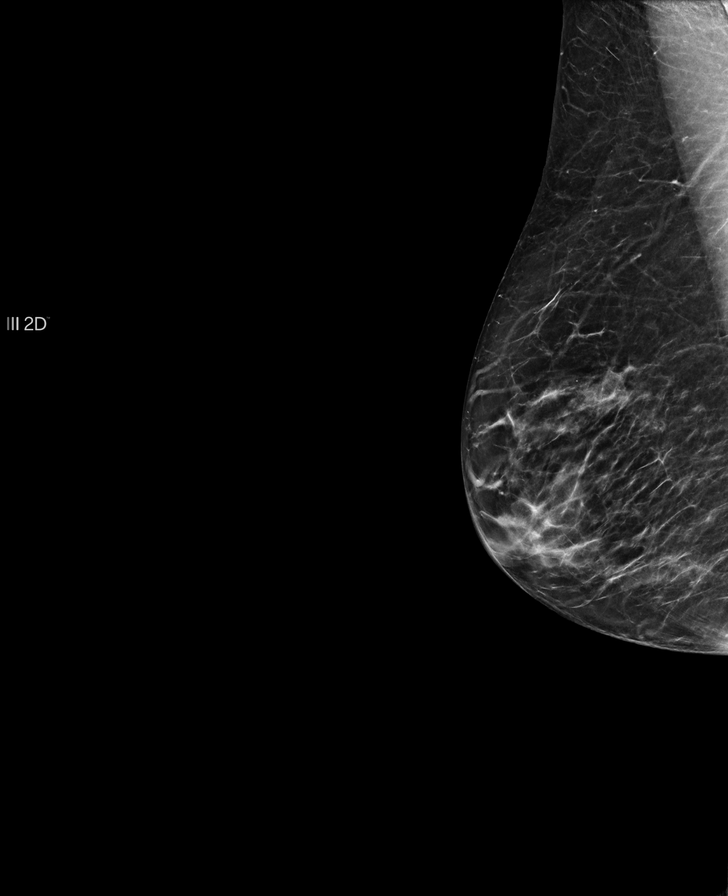

[L CC]
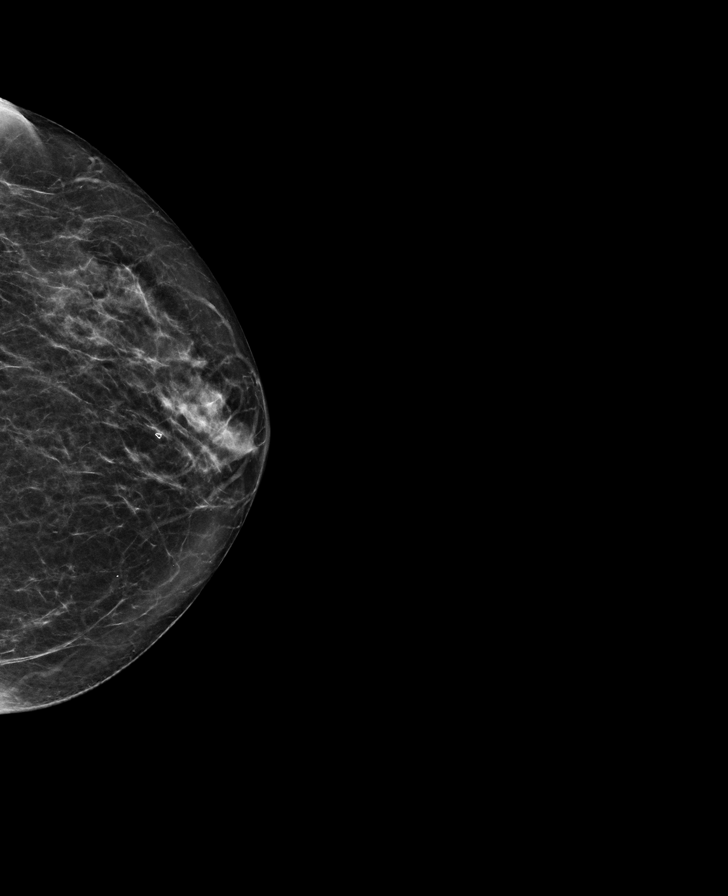

[L MLO]
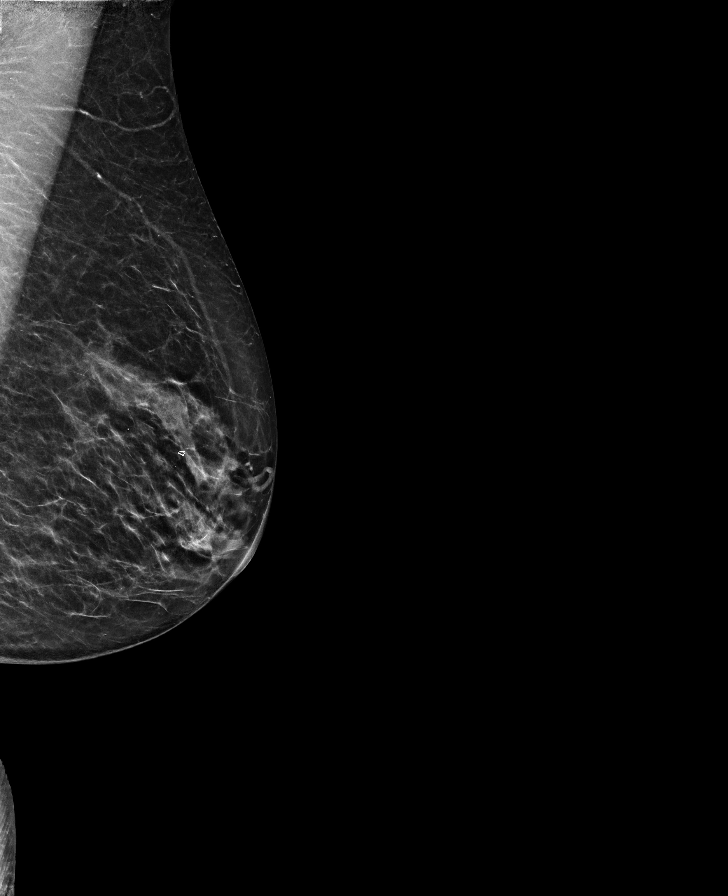

[R CC]
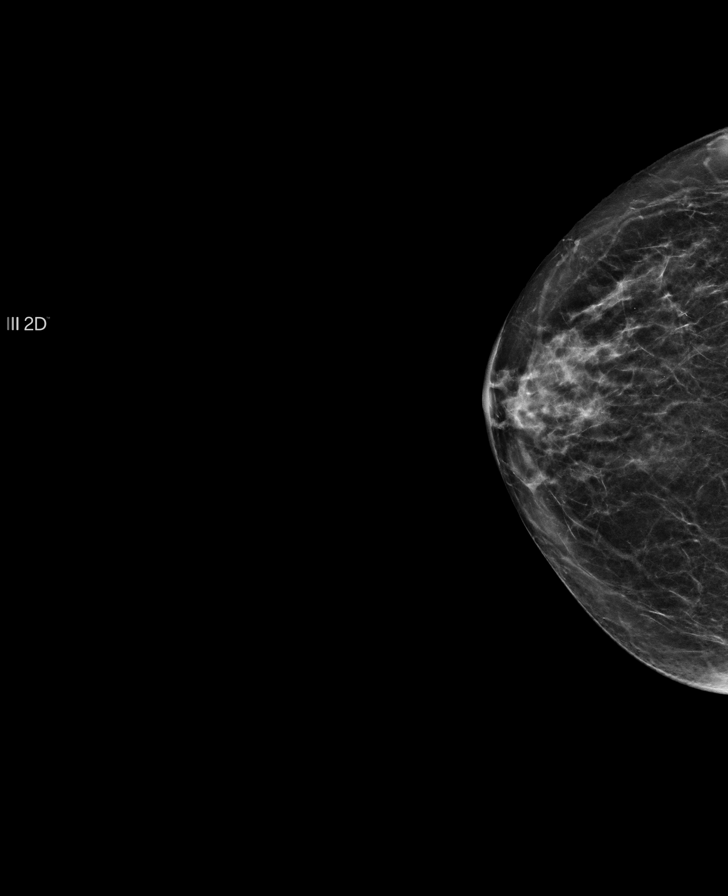

[R CC tomo · tomo slice 25/48.0]
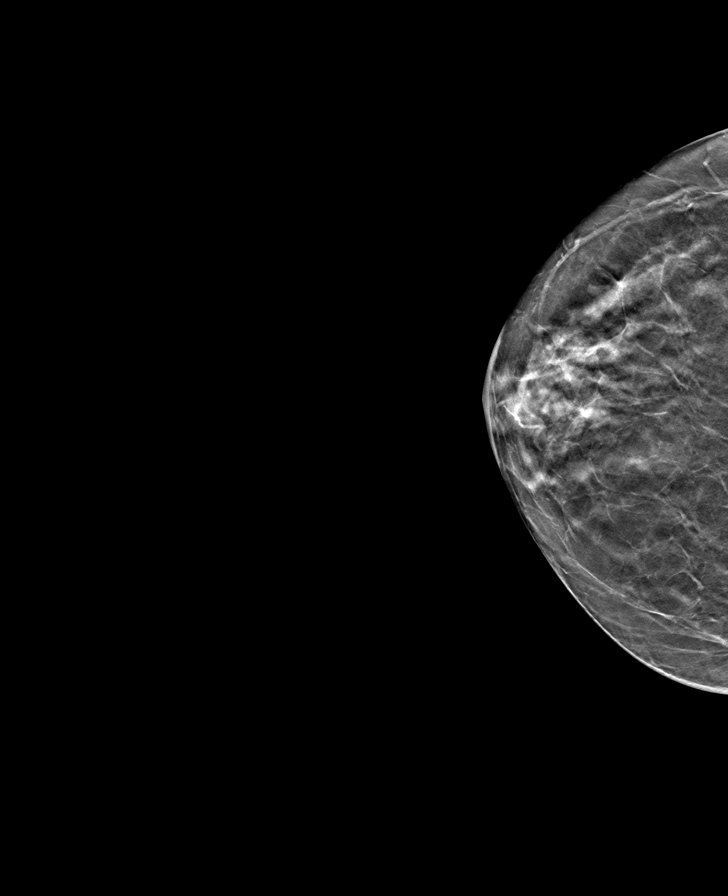

[L CC tomo · tomo slice 27/52.0]
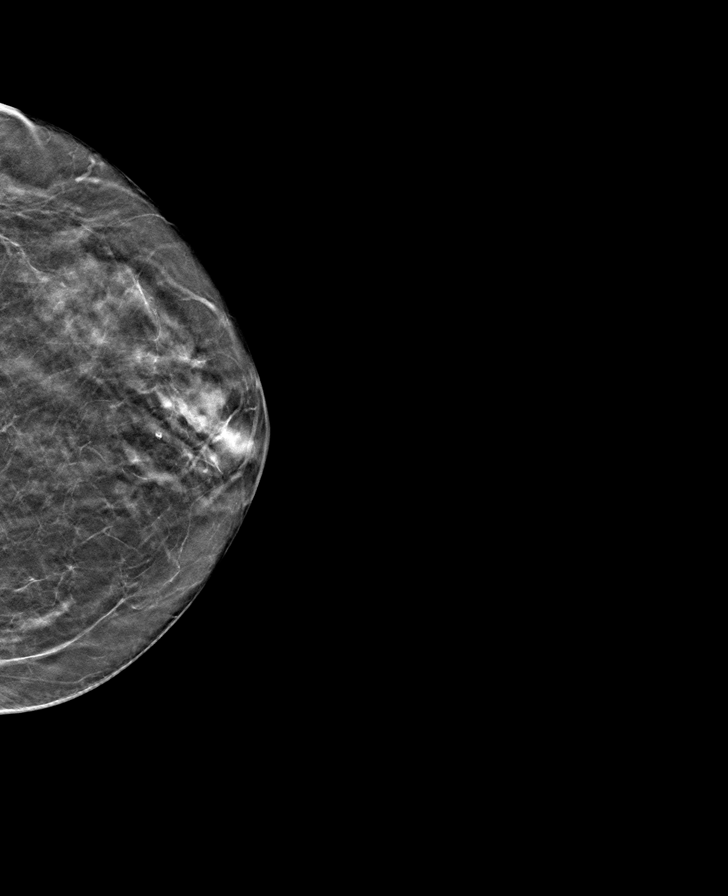

[R MLO tomo · tomo slice 30/59.0]
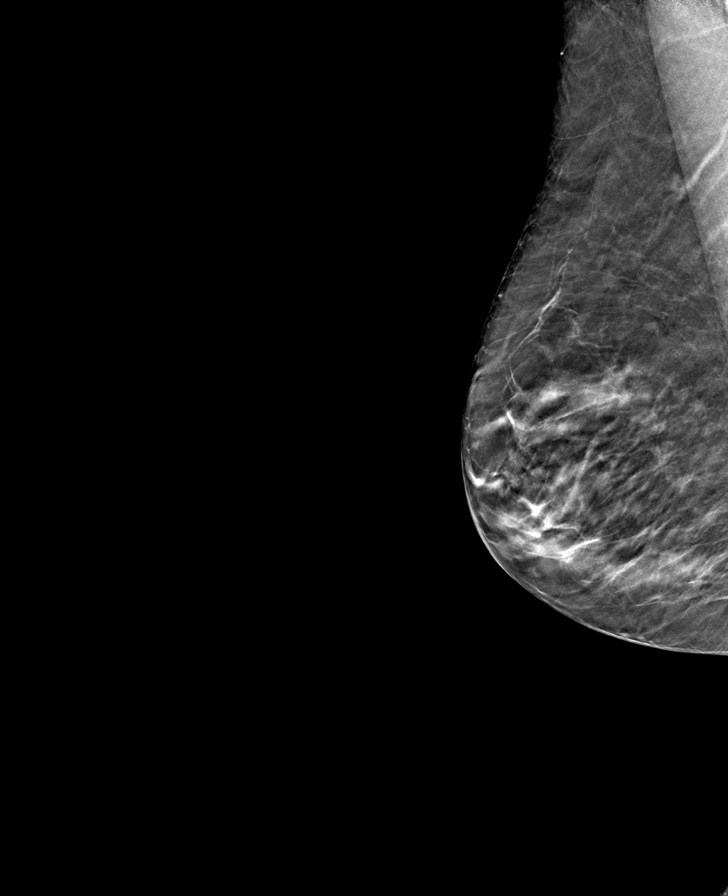

[L MLO tomo · tomo slice 31/60.0]
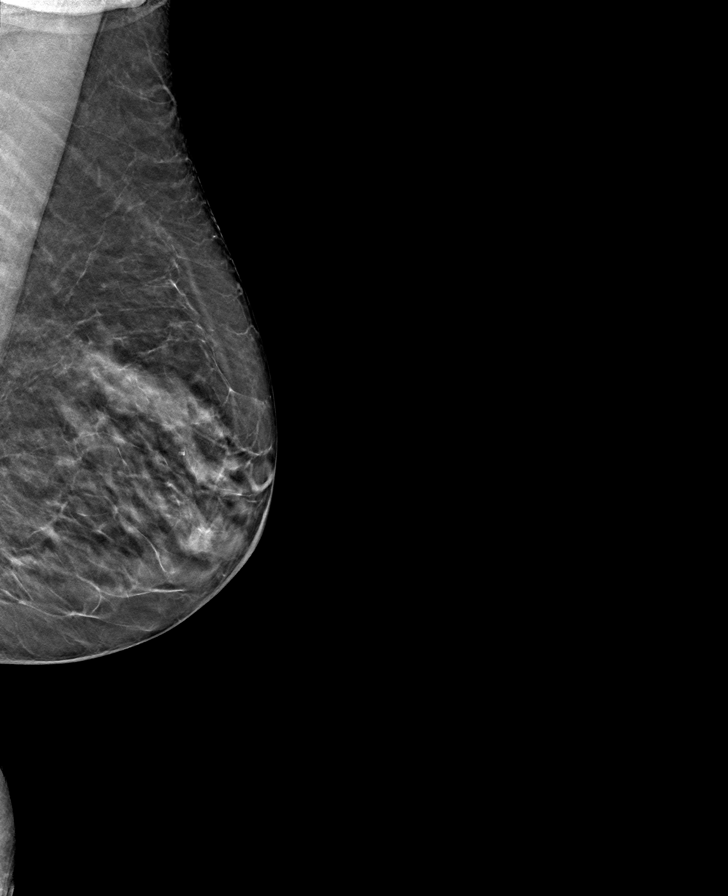

[8 of 24 positions shown; findings below may reference images not displayed]

FINDINGS: Postbiopsy tissue marker left breast. No mass, area of architectural 
distortion or suspicious calcification. No mammographic evidence of malignancy. 
Overall stable mammographic appearance.
IMPRESSION: ( BI-RADS 2) Benign findings. Routine mammographic follow-up is recommended.

## 2021-01-13 IMAGING — CT CT CHEST WITH CONTRAST
2 of 3 series · 15 of 36 positions shown, 18 images · IV contrast (isovue)
Comparison: 02/14/2018

CT CHEST WITH CONTRAST, 01/13/2021 [DATE]: 
CLINICAL INDICATION:  Chest pain. 
A search for DICOM formatted images was conducted for prior CT imaging studies 
completed at a non-affiliated media free facility.
TECHNIQUE: The chest was scanned from base of neck through the lung bases with 
100 mL of Isovue 300 injected intravenously on a high resolution low dose CT 
scanner. Routine MPR and MIP 3D renderings were reconstructed on an independent 
workstation with concurrent physician supervision.

[Series 4: chest 2.0 i31s 3 · axial · 0.76mm/px · z∈[-311,-21]mm · 12 of 171 slices shown, 15 images]
[im 13/171  mediastinal]
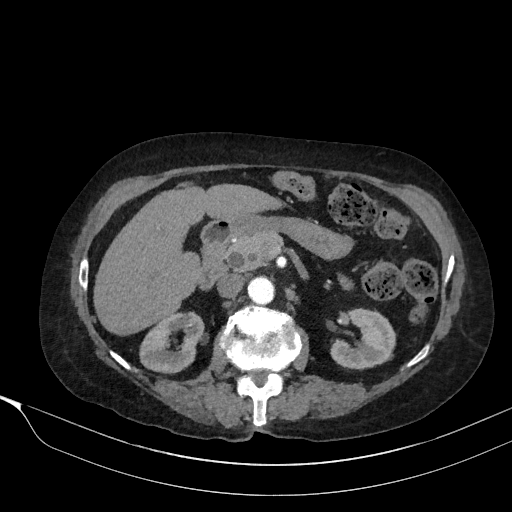
[im 13/171  lung]
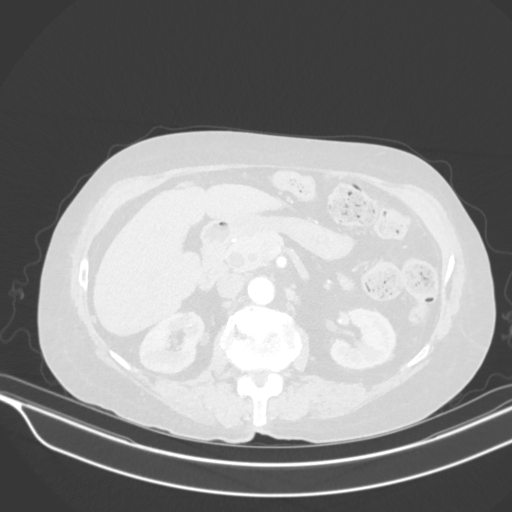
[im 26/171  lung]
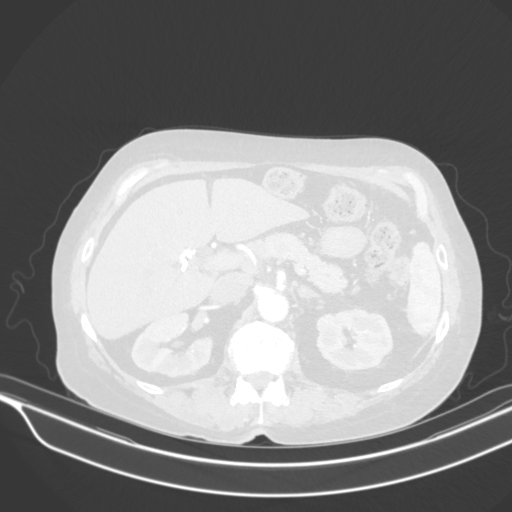
[im 38/171  lung]
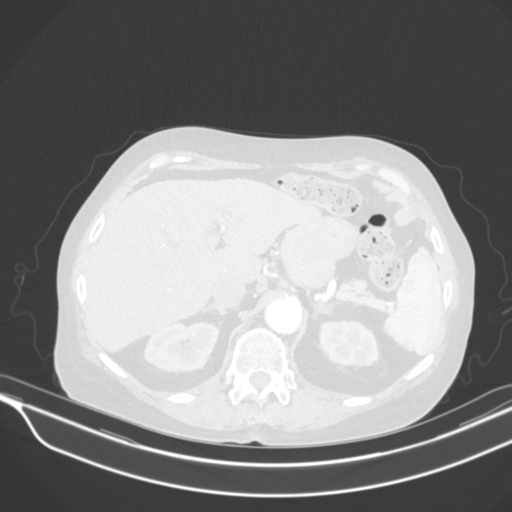
[im 51/171  lung]
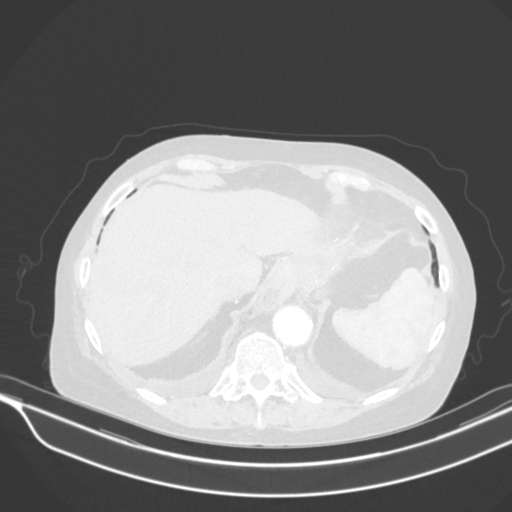
[im 63/171  mediastinal]
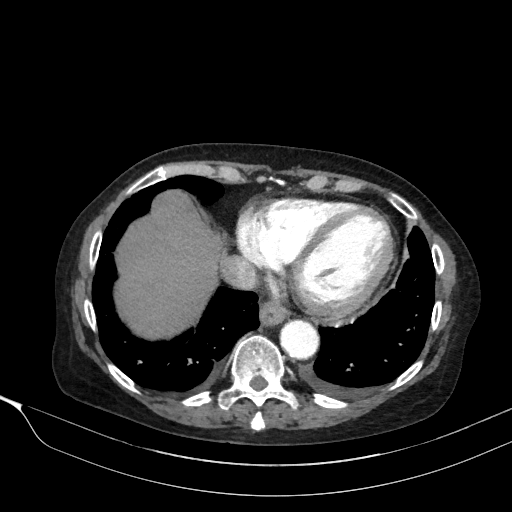
[im 63/171  lung]
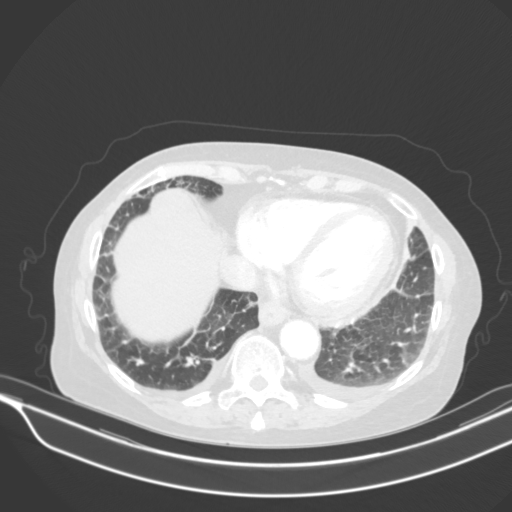
[im 76/171  lung]
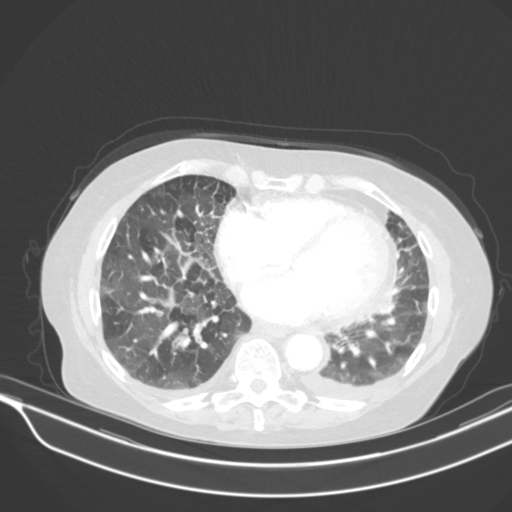
[im 95/171  lung]
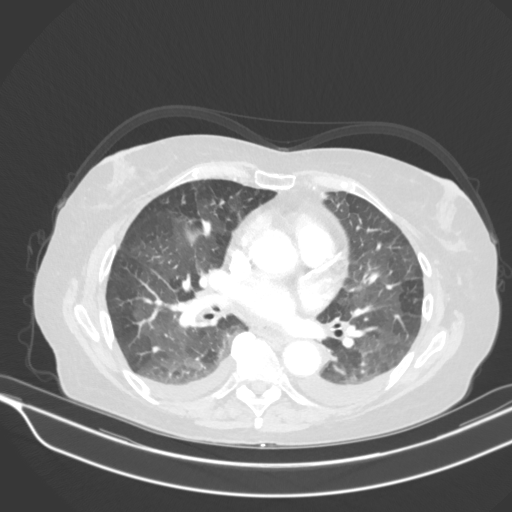
[im 108/171  lung]
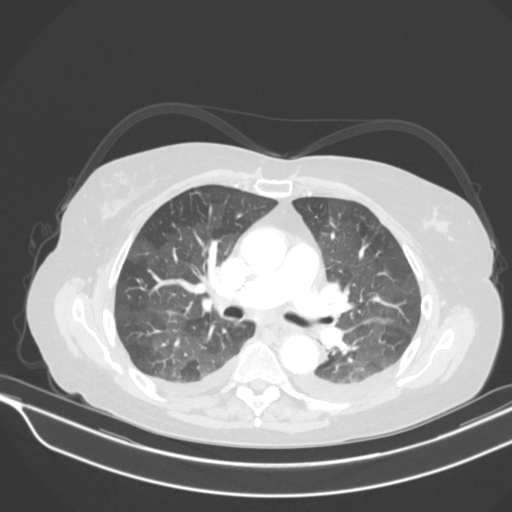
[im 120/171  mediastinal]
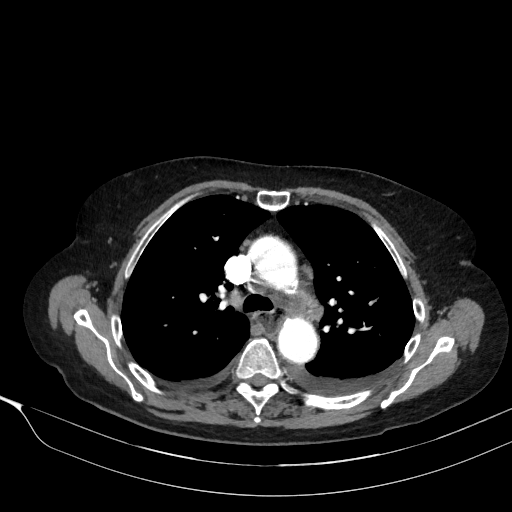
[im 120/171  lung]
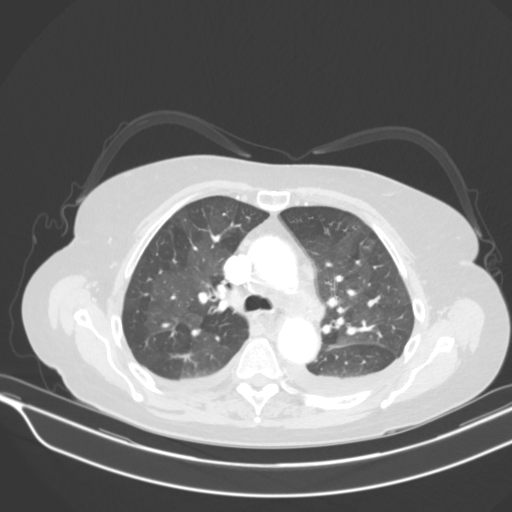
[im 133/171  lung]
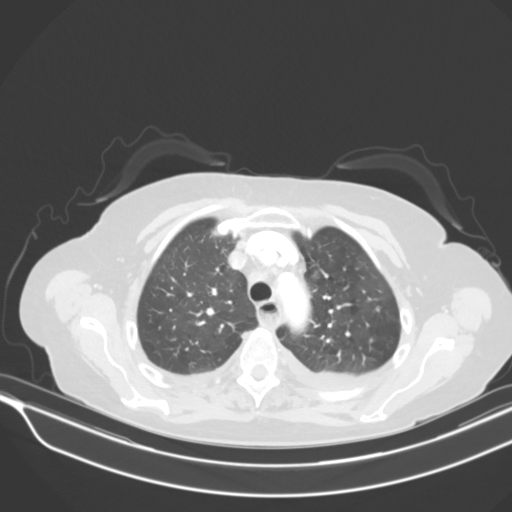
[im 145/171  lung]
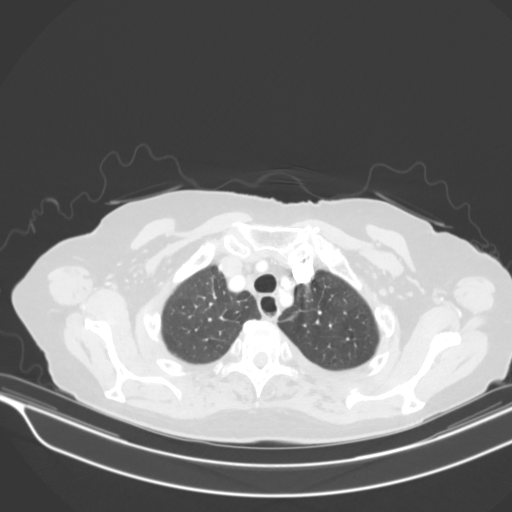
[im 158/171  lung]
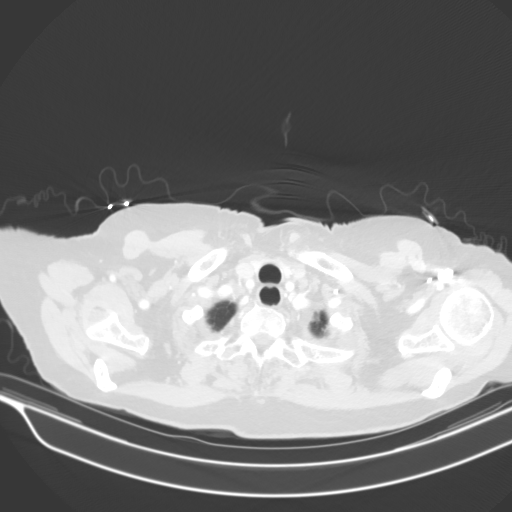

[Series 7: coronal · coronal · 0.67mm/px · 3 of 129 slices shown]
[im 26/129  lung]
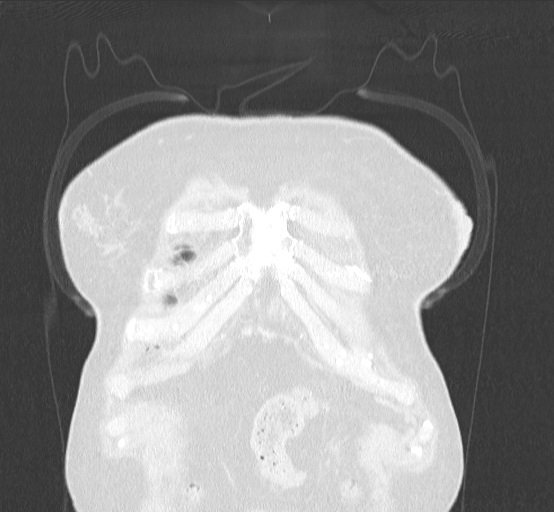
[im 52/129  lung]
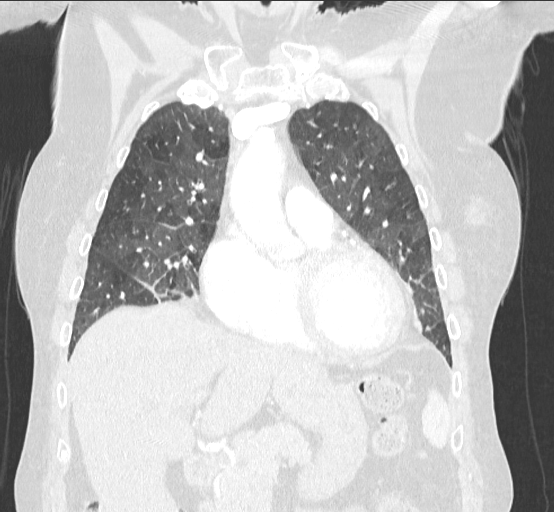
[im 77/129  lung]
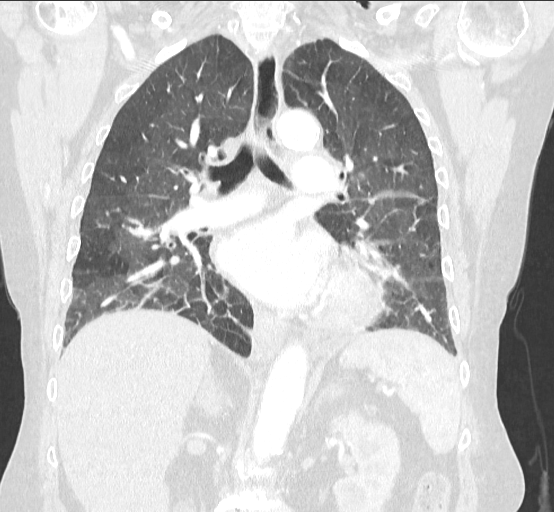

[15 of 36 positions shown; findings below may reference images not displayed]

FINDINGS: LUNGS AND PLEURA:  Patchy groundglass infiltrates/mosaic pattern has progressed 
compared previous exam now with small effusions but no large area of 
consolidation. Stable mild peribronchial thickening the lower lobe bronchi. 
MEDIASTINUM:  No adenopathy. Mild prominence cardiac silhouette. No pericardial 
effusion. Moderate severe coronary artery calcification. Distention of the 
esophagus with debris within the esophagus appears stable compared to the 
previous exam. 
CHEST WALL/AXILLA: No mass or adenopathy. 
UPPER ABDOMEN: Fatty liver. Status post cholecystectomy. 
MUSCULOSKELETAL: No acute abnormality.
IMPRESSION: Progression of patchy groundglass opacities and mosaic pattern compared to the 
previous exam now with small bilateral effusions. Stable peribronchial 
thickening. No large area consolidation. The mosaic pattern suggests small 
airways disease. 
No mediastinal or hilar adenopathy. 
Stable distended esophagus. 
RADIATION DOSE REDUCTION: All CT scans are performed using radiation dose 
reduction techniques, when applicable.  Technical factors are evaluated and 
adjusted to ensure appropriate moderation of exposure.  Automated dose 
management technology is applied to adjust the radiation doses to minimize 
exposure while achieving diagnostic quality images.

## 2021-03-03 IMAGING — CT CT CHEST WITHOUT CONTRAST
1 of 2 series · 15 of 32 positions shown, 19 images · non-contrast
Comparison: Comparison was made to the prior exam(s) within the last 12 months 
01/13/2021

CT CHEST WITHOUT CONTRAST, 03/03/2021 [DATE]: 
CLINICAL INDICATION:  Pneumonitis. Evaluate for tumors a pattern. 
A search for DICOM formatted images was conducted for prior CT imaging studies 
completed at a non-affiliated media free facility.
TECHNIQUE: The chest was scanned from base of neck through the lung bases 
without contrast on a high resolution low dose CT scanner. Routine MPR and MIP 
3D renderings were reconstructed on an independent workstation with concurrent 
physician supervision.

[Series 201: chest wo, idose (3) · axial · 0.78mm/px · z∈[-249,+39]mm · 15 of 156 slices shown, 19 images]
[im 6/156  mediastinal]
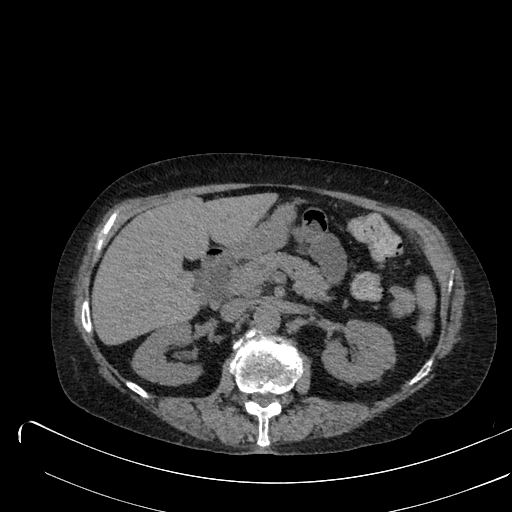
[im 6/156  lung]
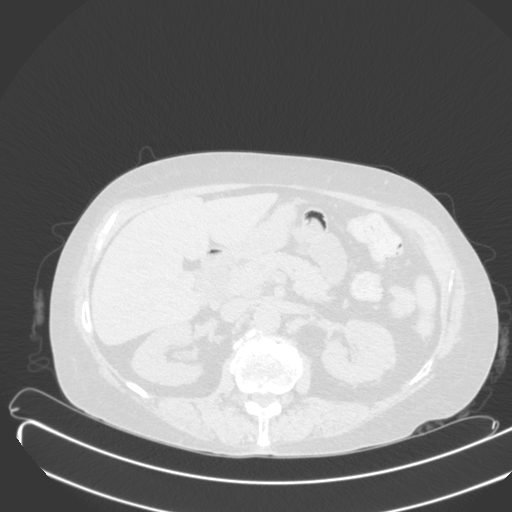
[im 18/156  lung]
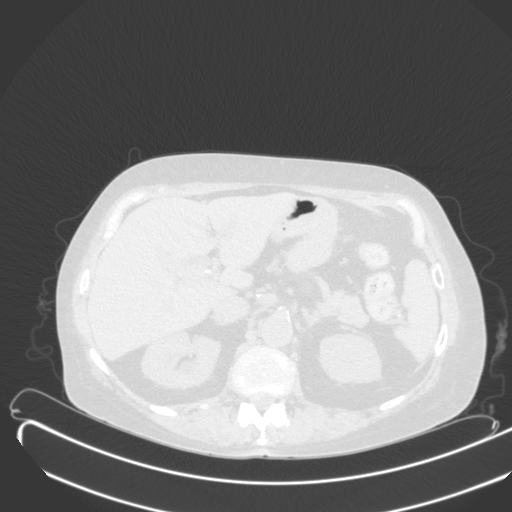
[im 29/156  lung]
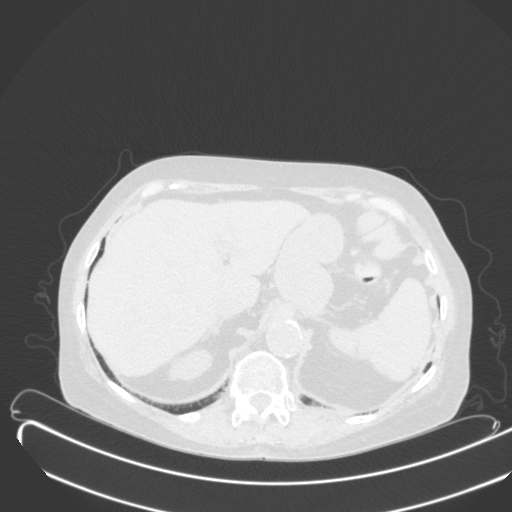
[im 41/156  lung]
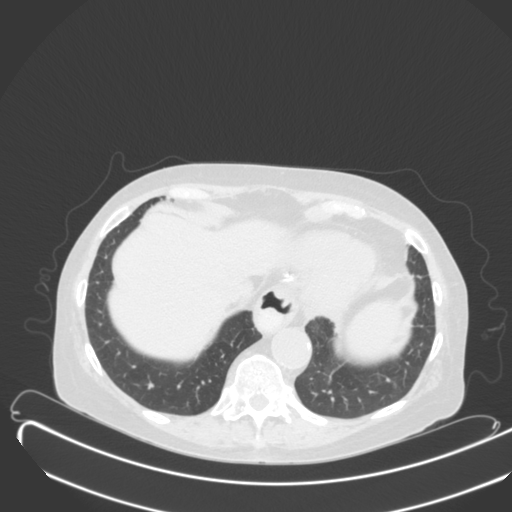
[im 52/156  mediastinal]
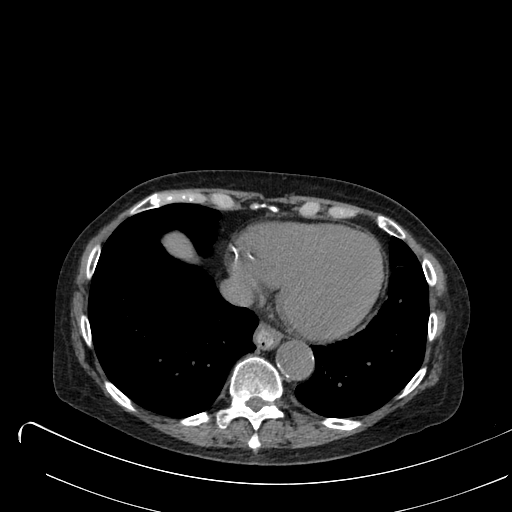
[im 52/156  lung]
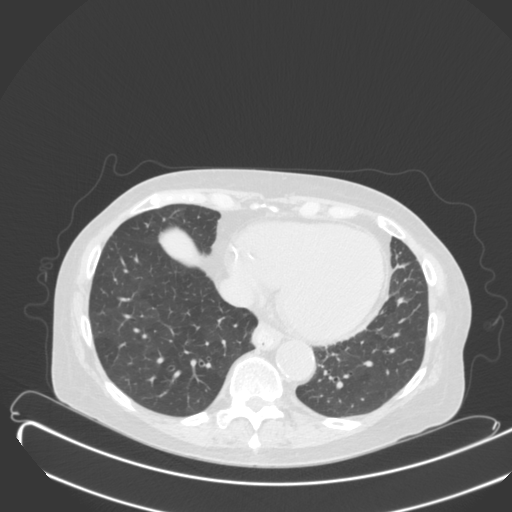
[im 58/156  lung]
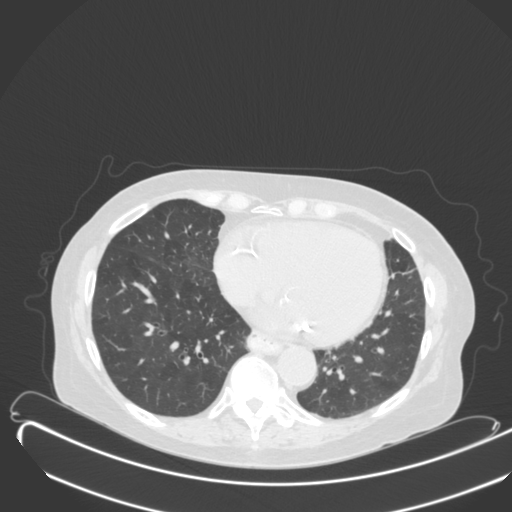
[im 69/156  lung]
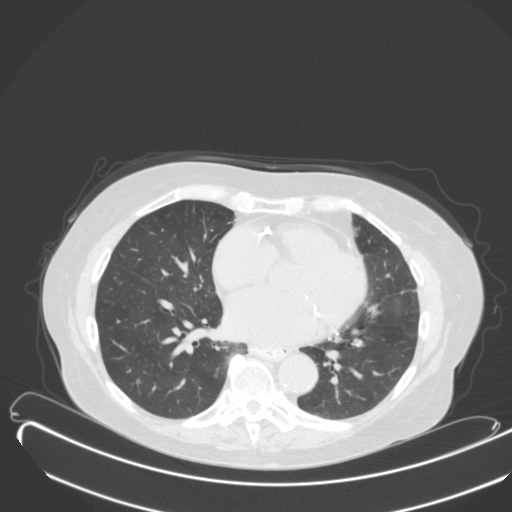
[im 78/156  lung]
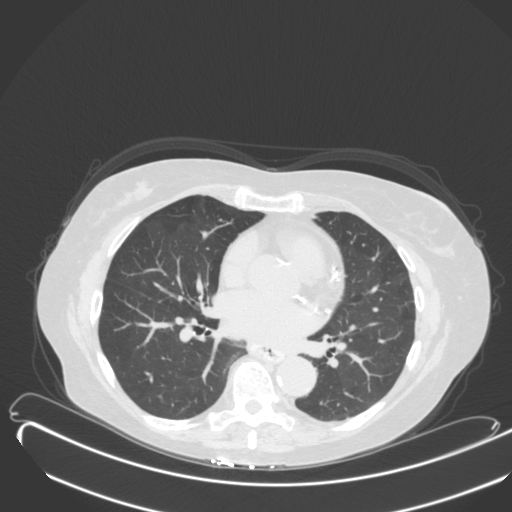
[im 87/156  mediastinal]
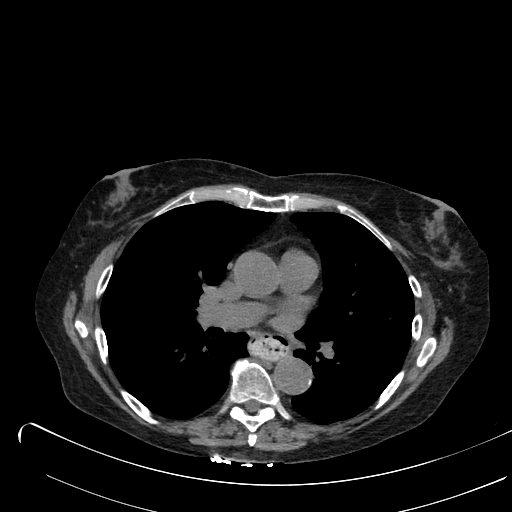
[im 87/156  lung]
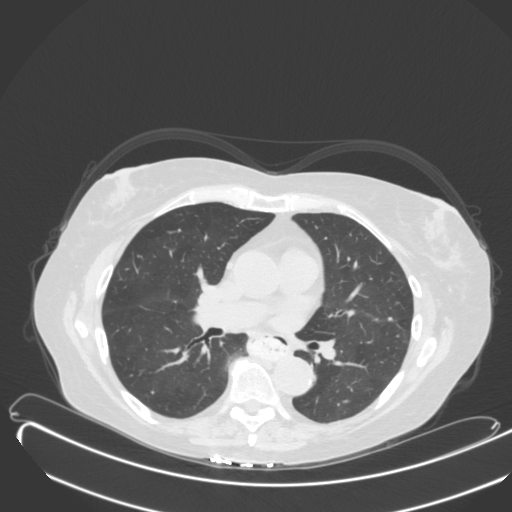
[im 98/156  lung]
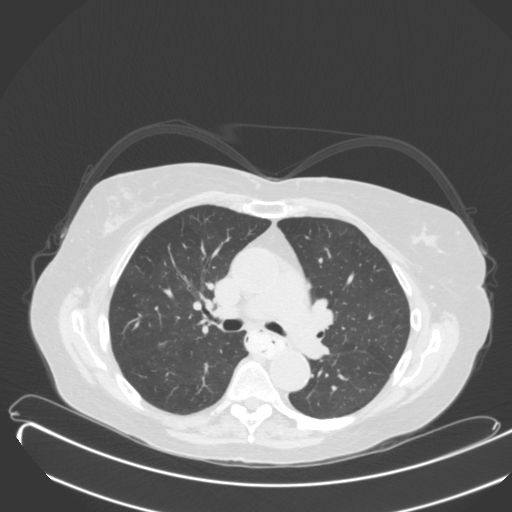
[im 104/156  lung]
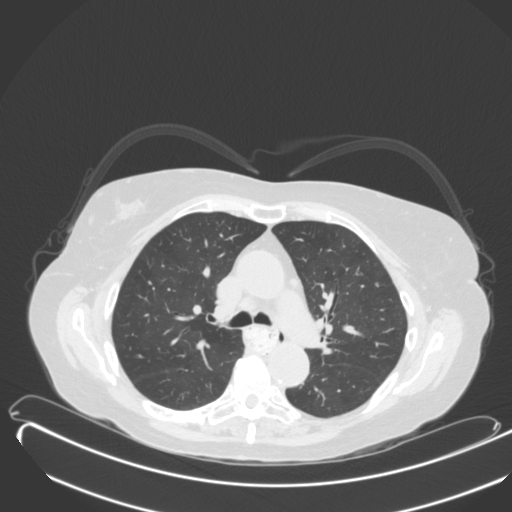
[im 115/156  lung]
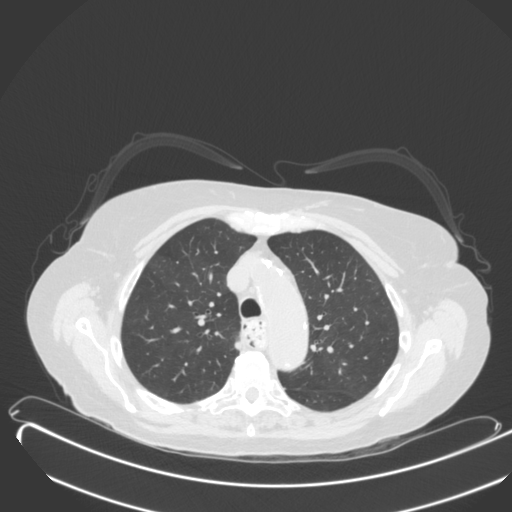
[im 127/156  mediastinal]
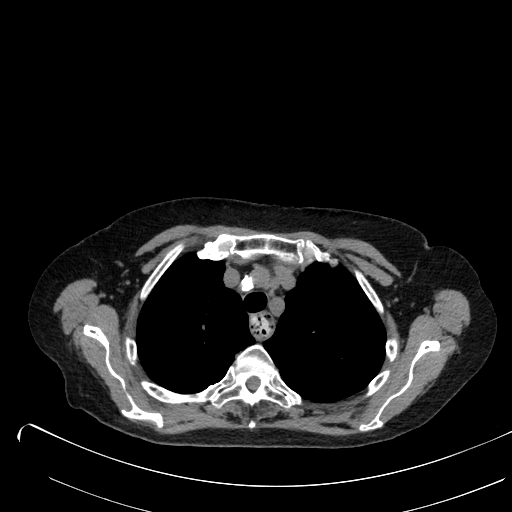
[im 127/156  lung]
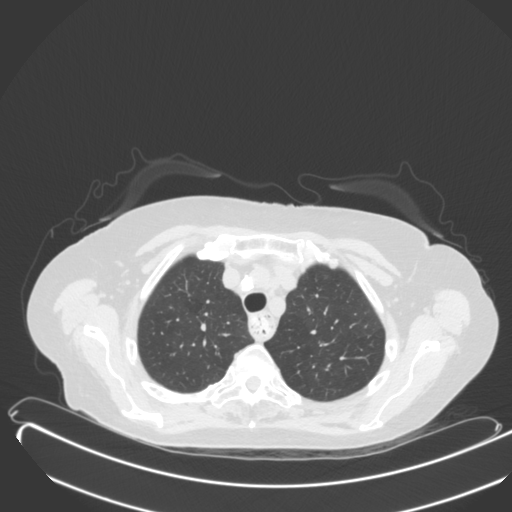
[im 138/156  lung]
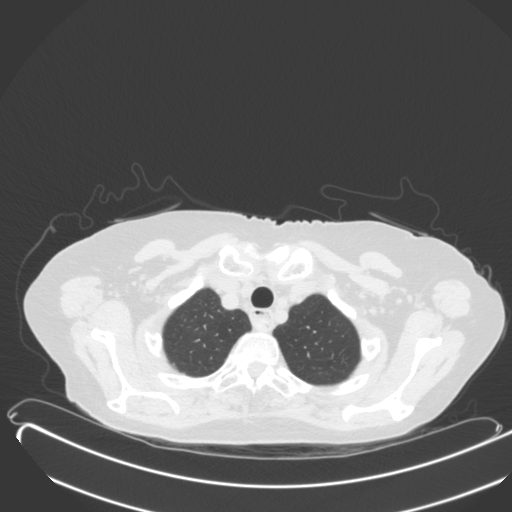
[im 150/156  lung]
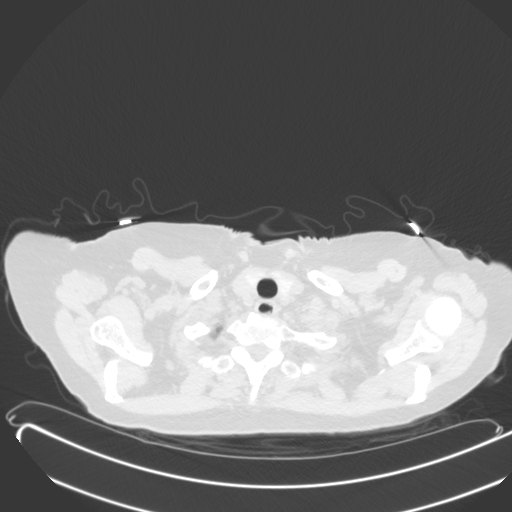

[15 of 32 positions shown; findings below may reference images not displayed]

FINDINGS: LUNGS AND PLEURA:  There is been interval improvement of previously seen 
groundglass infiltrates and mosaic pattern with only minor residual mosaic 
pattern present and no new mass or consolidation seen in the lungs. 
Peribronchial thickening has resolved and prominence of the interstitium in the 
lower lobes has also resolved. 
MEDIASTINUM:  No adenopathy. Normal heart size. No pericardial effusion. The 
esophagus is visualized and is distended with what appears to be food possibly 
due to reflux. No focal thickening or mass seen in the esophagus. 
CHEST WALL/AXILLA: No mass or adenopathy. Moderate to severe coronary 
calcifications are present 
UPPER ABDOMEN: Negative. 
MUSCULOSKELETAL: No acute abnormality.
IMPRESSION: Marked interval improvement of previously seen groundglass infiltrates and 
mosaic pattern in the lungs with only minimal residual mosaic pattern. 
Peribronchial thickening has resolved as well as prominent of the interstitium 
is also resolved. 
Moderate to severe coronary calcifications are present. 
No new mass or consolidations or pulmonary nodules seen in the lungs. 
RADIATION DOSE REDUCTION: All CT scans are performed using radiation dose 
reduction techniques, when applicable.  Technical factors are evaluated and 
adjusted to ensure appropriate moderation of exposure.  Automated dose 
management technology is applied to adjust the radiation doses to minimize 
exposure while achieving diagnostic quality images.

## 2021-03-18 ENCOUNTER — Other Ambulatory Visit (INDEPENDENT_AMBULATORY_CARE_PROVIDER_SITE_OTHER): Payer: Self-pay | Admitting: Family Medicine

## 2021-03-18 DIAGNOSIS — R718 Other abnormality of red blood cells: Secondary | ICD-10-CM

## 2021-03-18 DIAGNOSIS — R5381 Other malaise: Secondary | ICD-10-CM

## 2021-03-18 DIAGNOSIS — E782 Mixed hyperlipidemia: Secondary | ICD-10-CM

## 2021-03-18 DIAGNOSIS — R5383 Other fatigue: Secondary | ICD-10-CM

## 2021-03-18 DIAGNOSIS — R739 Hyperglycemia, unspecified: Secondary | ICD-10-CM

## 2021-04-16 ENCOUNTER — Other Ambulatory Visit (INDEPENDENT_AMBULATORY_CARE_PROVIDER_SITE_OTHER): Payer: Self-pay | Admitting: Family Medicine

## 2021-06-18 ENCOUNTER — Other Ambulatory Visit (INDEPENDENT_AMBULATORY_CARE_PROVIDER_SITE_OTHER): Payer: Self-pay | Admitting: Family Medicine

## 2021-07-23 ENCOUNTER — Other Ambulatory Visit (INDEPENDENT_AMBULATORY_CARE_PROVIDER_SITE_OTHER): Payer: Medicare Other | Admitting: Family Medicine

## 2021-09-02 ENCOUNTER — Other Ambulatory Visit (INDEPENDENT_AMBULATORY_CARE_PROVIDER_SITE_OTHER): Payer: Self-pay | Admitting: Family Medicine

## 2021-12-19 IMAGING — MG MAMMOGRAPHY SCREENING BILATERAL 3[PERSON_NAME]
8 series · 8 of 24 positions shown · non-contrast
Comparison: Comparison was made to prior examinations.

________________________________________________________________________________________________ 
MAMMOGRAPHY SCREENING BILATERAL 3IGRISHTA MOCIC, 12/19/2021 [DATE]: 
CLINICAL INDICATION: Screening. History of negative LEFT breast biopsy.
TECHNIQUE: Digital bilateral mammograms and 3-D Tomosynthesis were obtained. 
These were interpreted both primarily and with the aid of computer-aided 
detection system.  
BREAST DENSITY: (Level B) There are scattered areas of fibroglandular density.

[L CC]
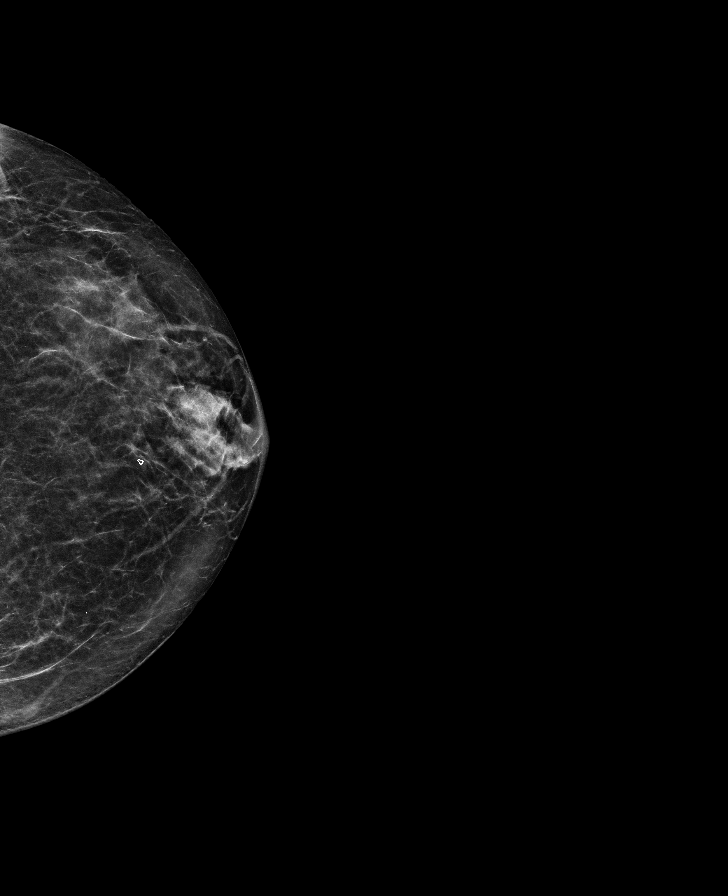

[L MLO]
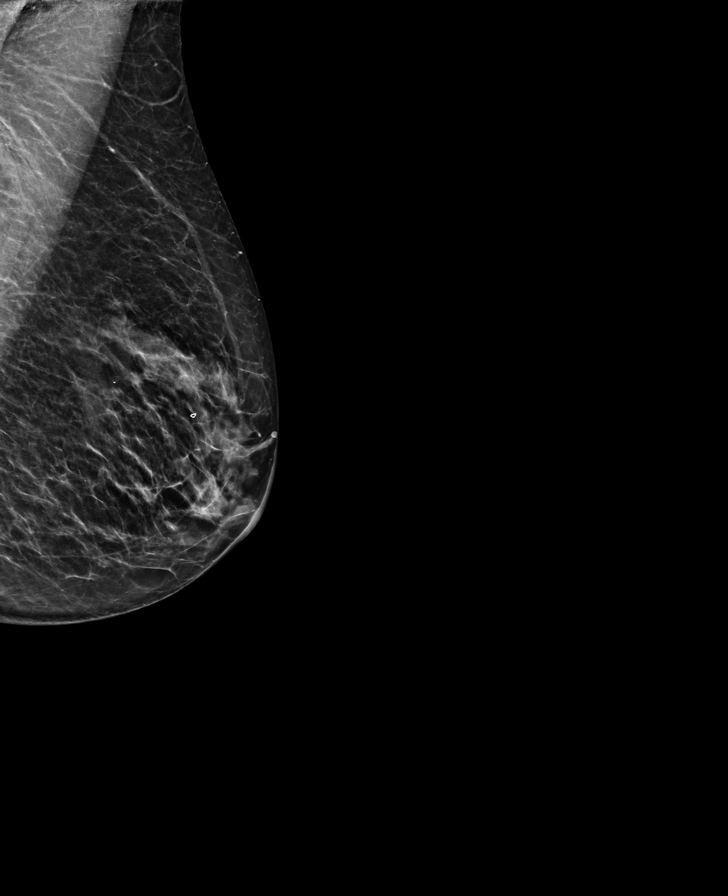

[R CC]
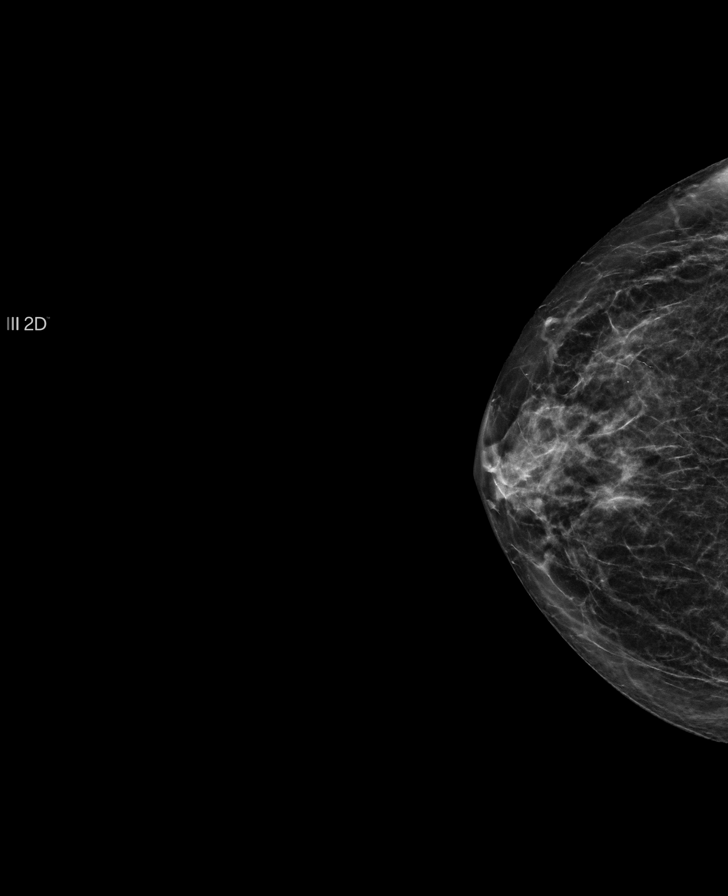

[R MLO]
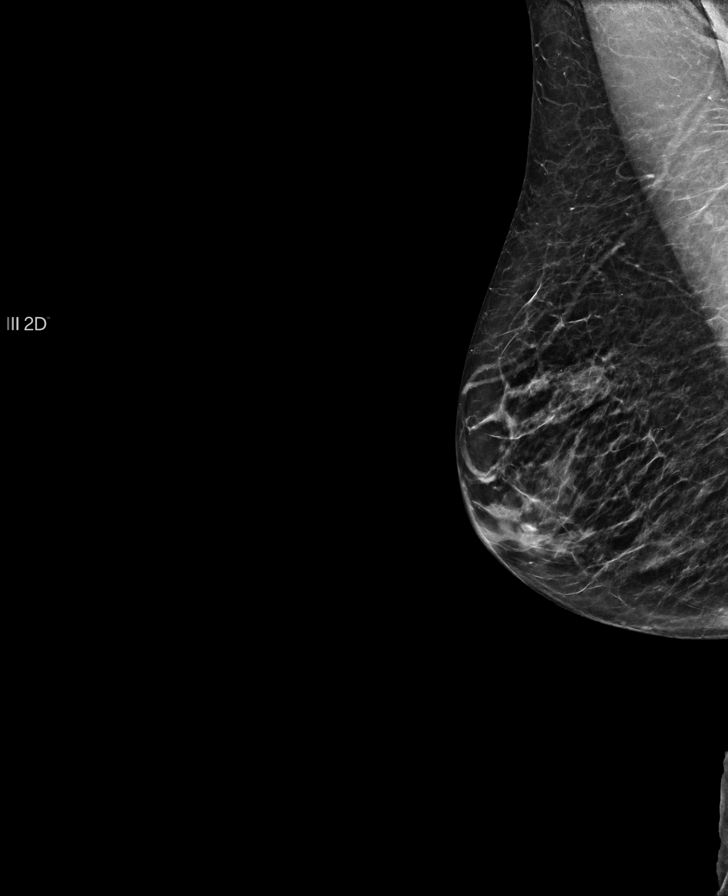

[R MLO tomo · tomo slice 27/52.0]
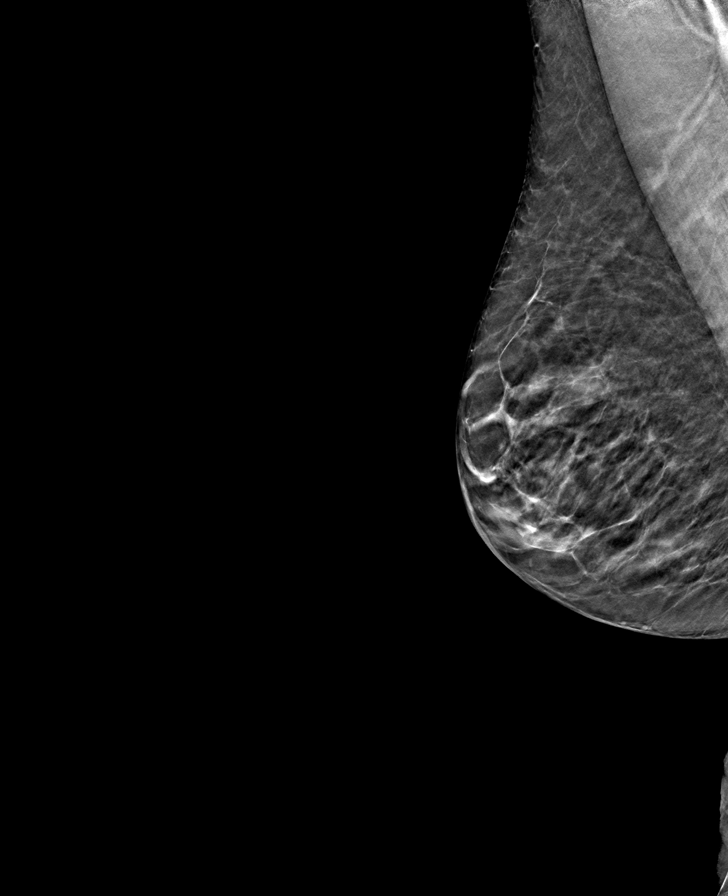

[L MLO tomo · tomo slice 29/56.0]
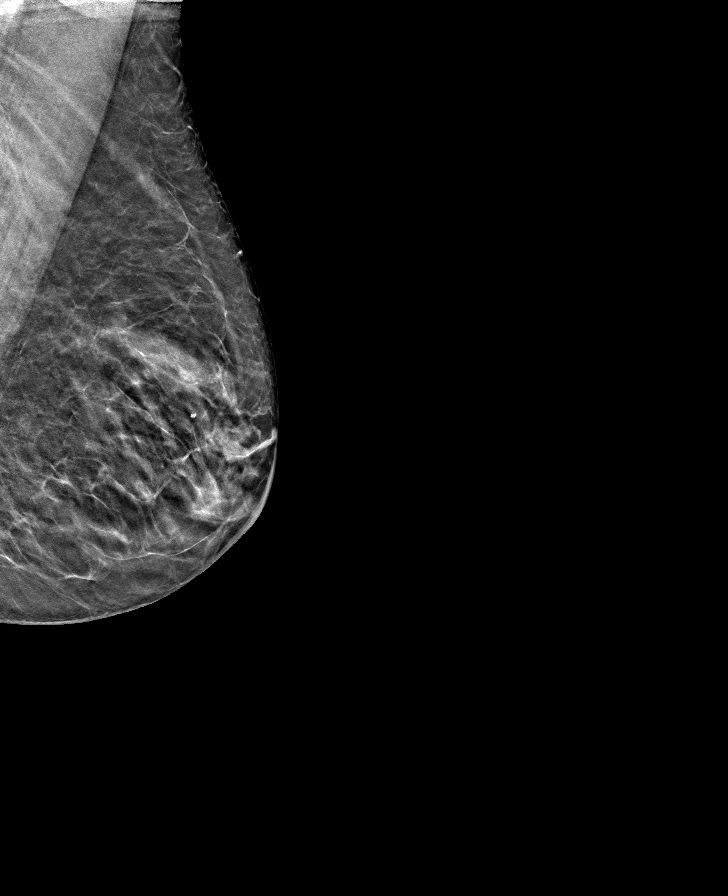

[L CC tomo · tomo slice 25/49.0]
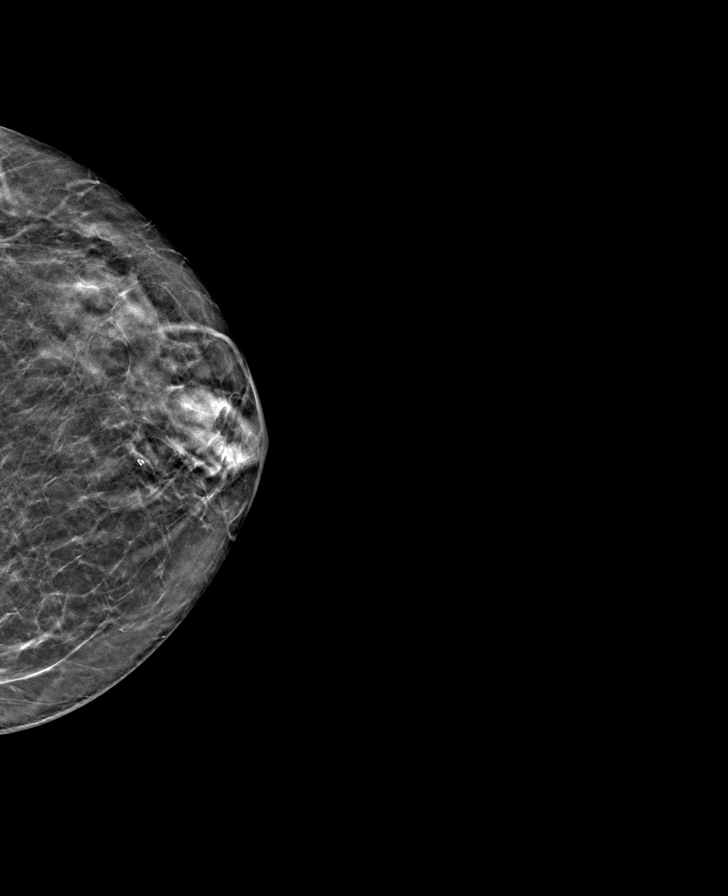

[R CC tomo · tomo slice 23/45.0]
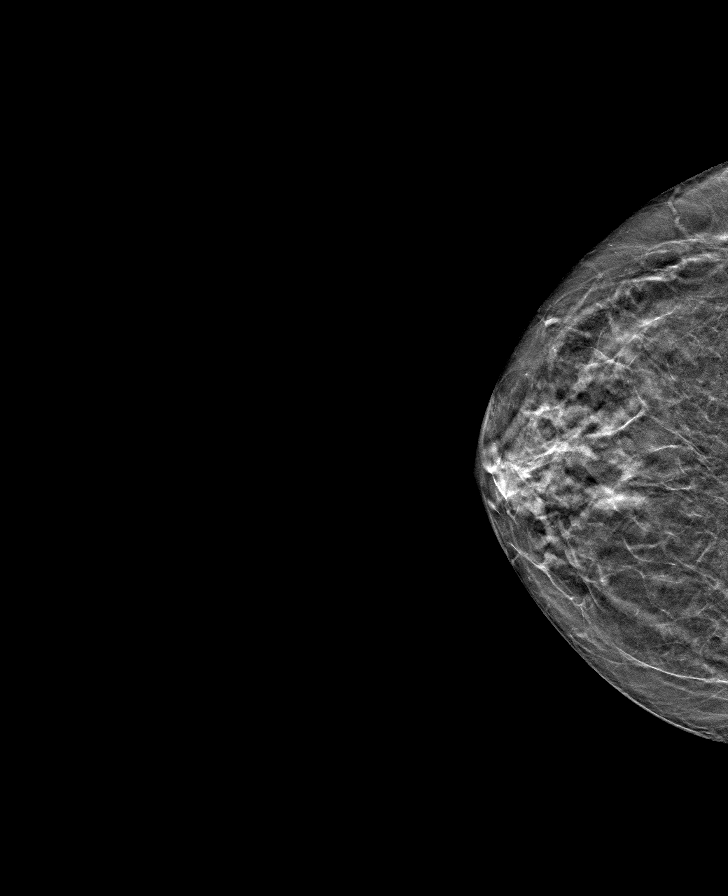

[8 of 24 positions shown; findings below may reference images not displayed]

FINDINGS: No mammographically suspicious abnormality and no significant change from prior 
mammograms.
IMPRESSION: (BI-RADS 1) Negative mammogram. Routine mammographic follow-up is recommended.

## 2022-03-31 IMAGING — DX CHEST PA AND LATERAL
1 series · 2 of 2 positions shown · non-contrast
Comparison: 03/03/2021 chest CT

________________________________________________________________________________________________ 
CLINICAL INDICATION: Other chest pain.

[Series 1: PA · U · 0.14mm/px · 2 of 2 slices shown]
[im 1/2]
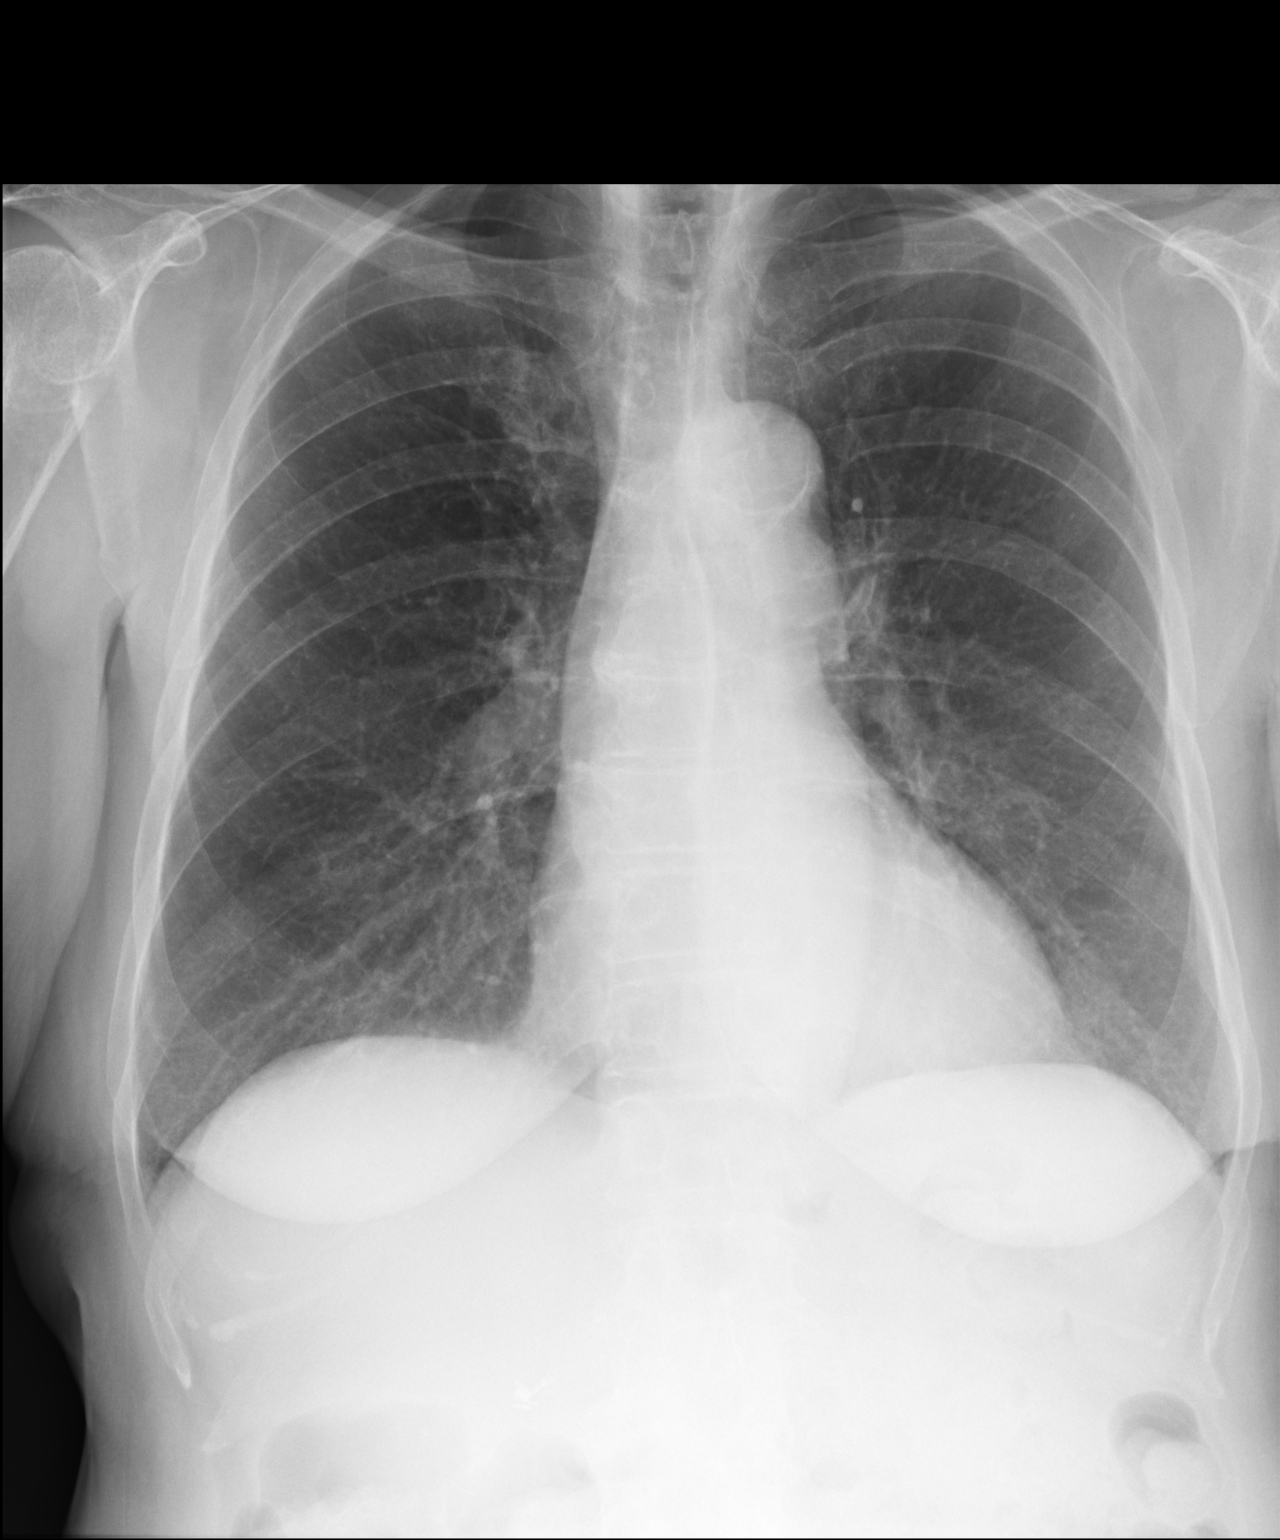
[im 2/2]
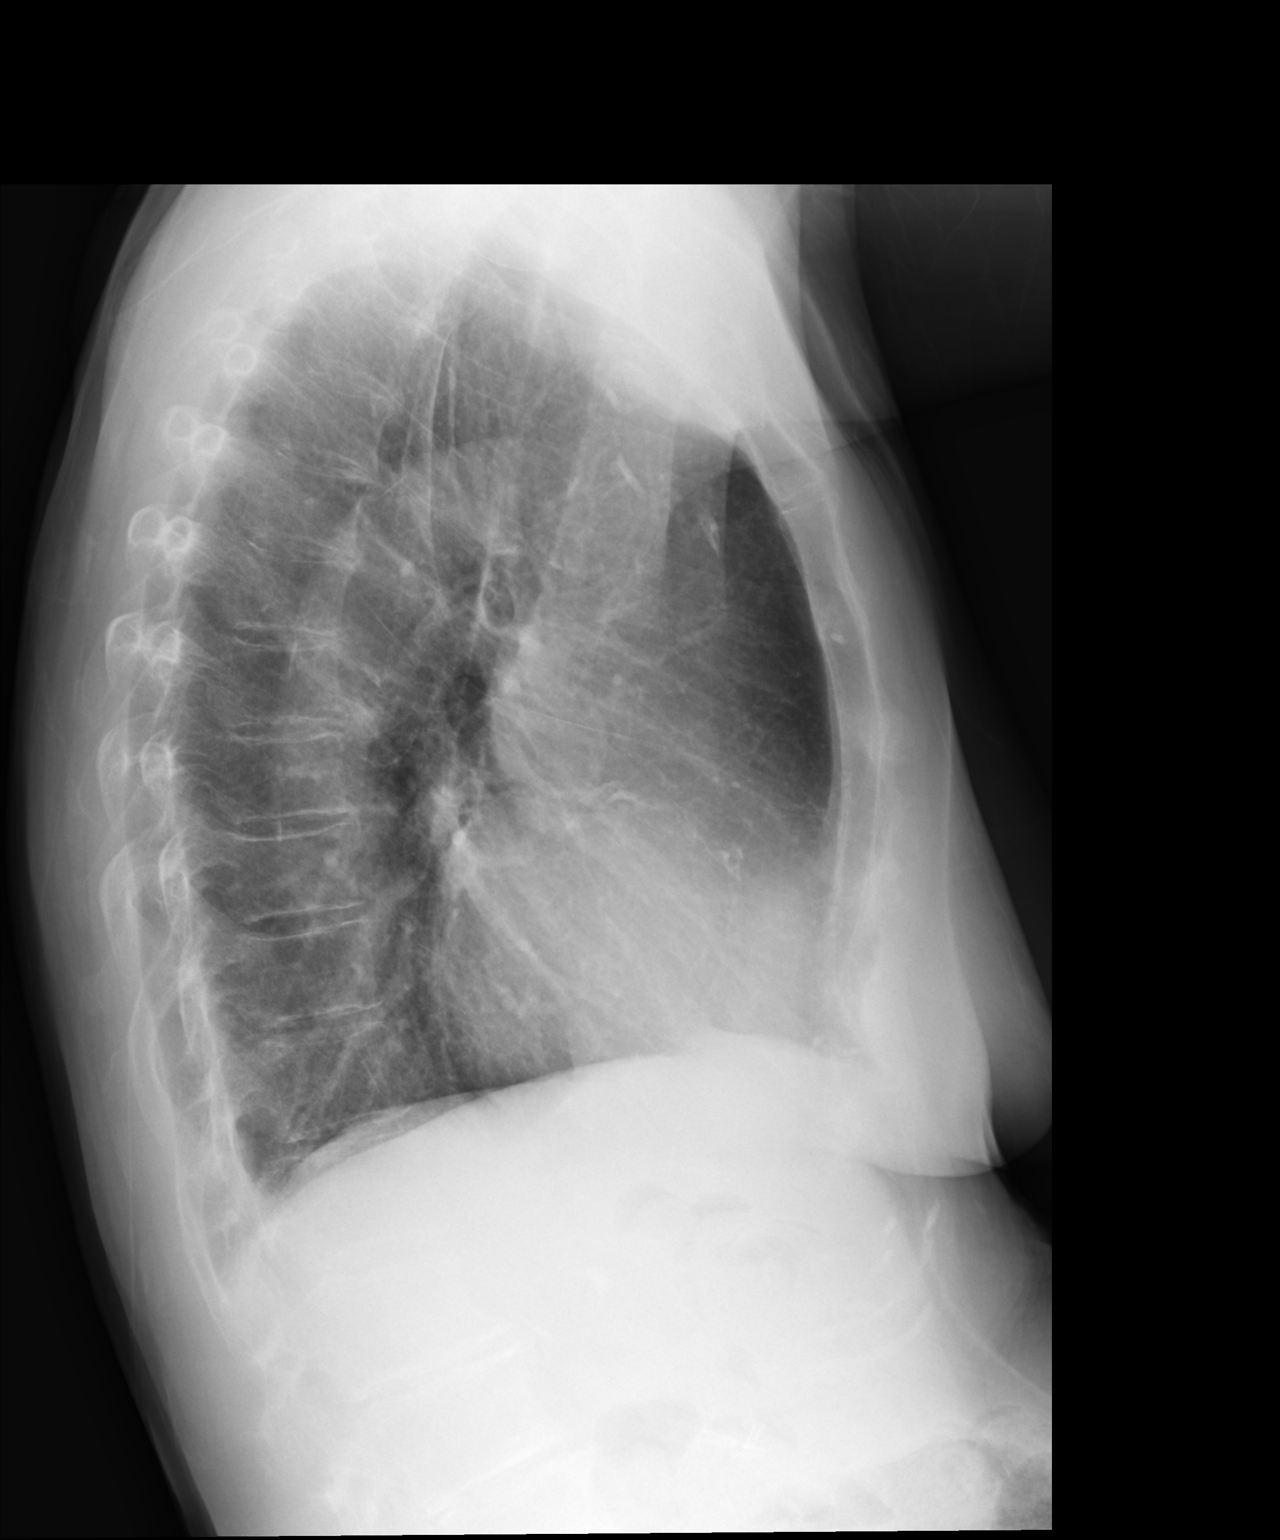

[2 of 2 positions shown; findings below may reference images not displayed]

FINDINGS: Lungs are clear. No consolidation. No effusion. Normal cardiac size 
and pulmonary vascularity. Atherosclerosis. No pneumothorax. No acute osseous 
abnormality.
IMPRESSION: No acute cardiopulmonary findings.

## 2022-08-14 IMAGING — CT CT ABDOMEN AND PELVIS WITH CONTRAST
2 of 3 series · 15 of 46 positions shown, 17 images · IV contrast (isovue)
Comparison: CT abdomen pelvis June 07, 2015.

________________________________________________________________________________________________ 
CT ABDOMEN AND PELVIS WITH CONTRAST, 08/14/2022 [DATE]: 
A search for DICOM formatted images was conducted for prior CT imaging studies 
completed at a non-affiliated media free facility.   
CLINICAL INDICATION: Abdominal distention. Abdominal discomfort. History of 
cholecystectomy.
TECHNIQUE: The abdomen and pelvis was scanned from lung bases through the pubic 
rami with 100 mL of Isovue 300 on a high-resolution CT scanner using dose 
reduction techniques.  Routine MPR reconstructions were performed.

[Series 4: abd/pel ax w · axial · 0.69mm/px · z∈[-688,-292]mm · 12 of 152 slices shown, 14 images]
[im 10/152  soft-tissue]
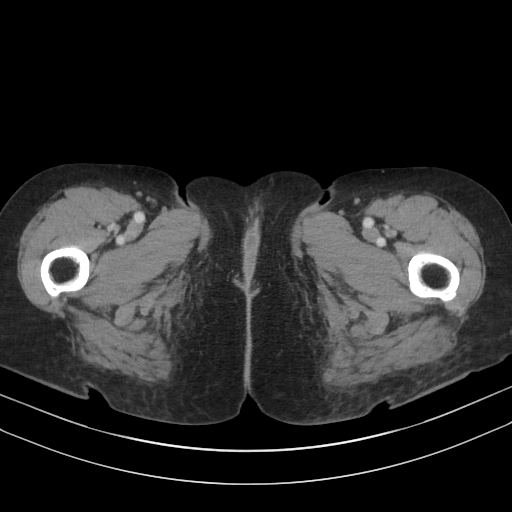
[im 10/152  bone]
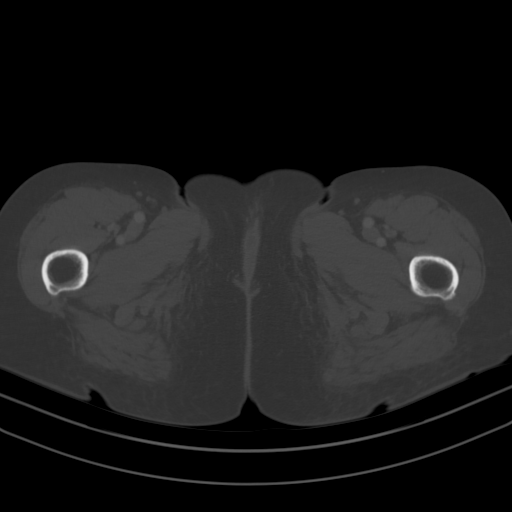
[im 20/152  soft-tissue]
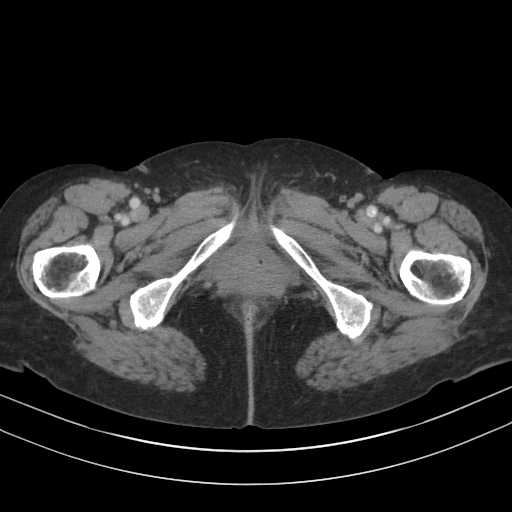
[im 35/152  soft-tissue]
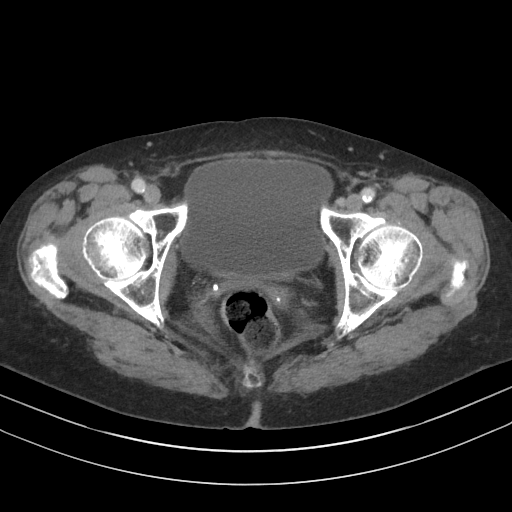
[im 44/152  soft-tissue]
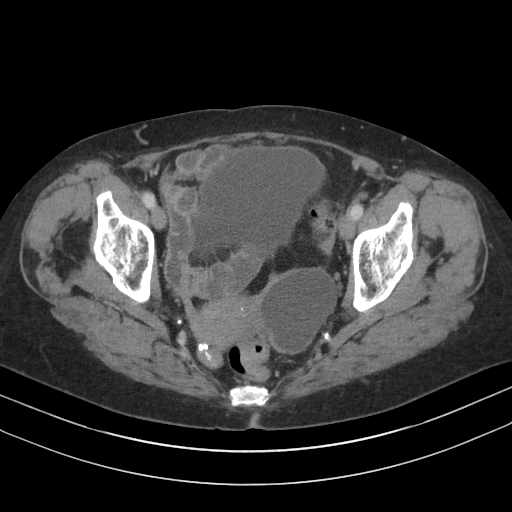
[im 59/152  soft-tissue]
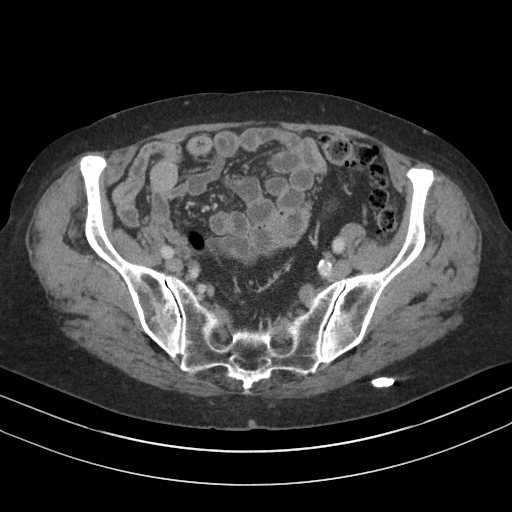
[im 69/152  soft-tissue]
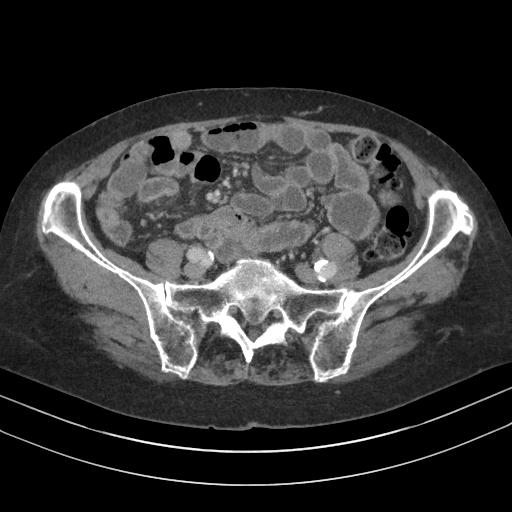
[im 83/152  soft-tissue]
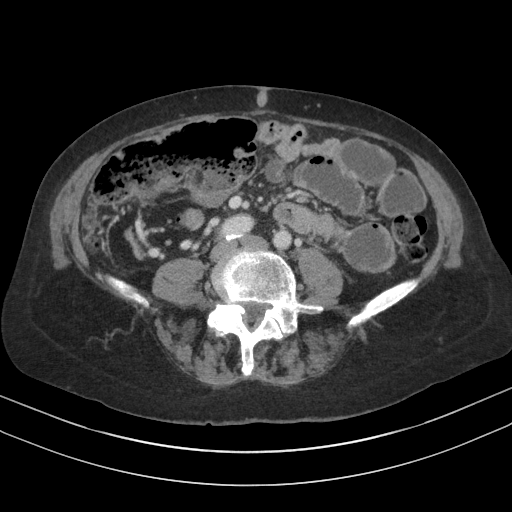
[im 93/152  soft-tissue]
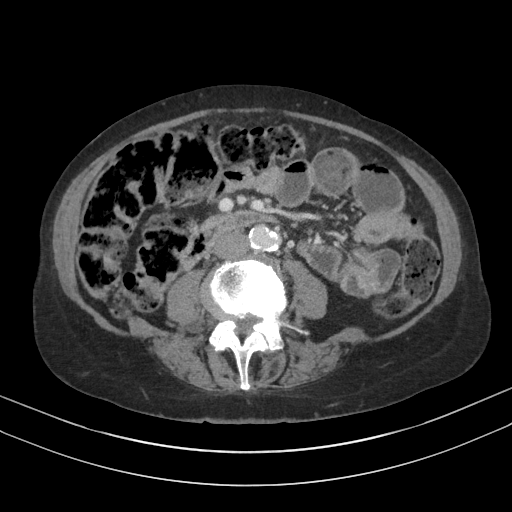
[im 108/152  soft-tissue]
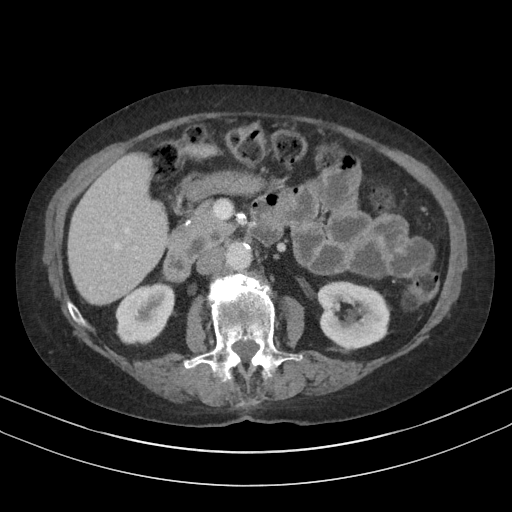
[im 108/152  bone]
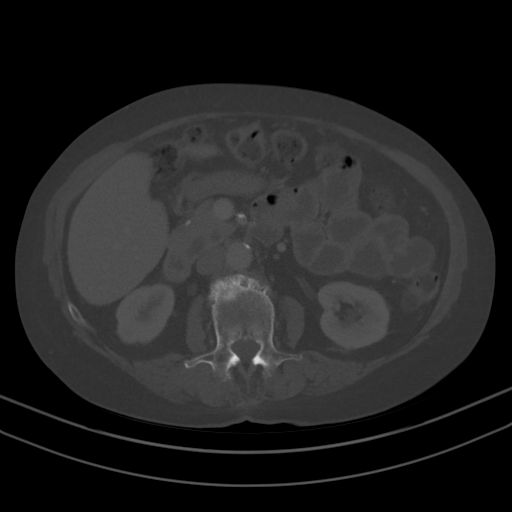
[im 117/152  soft-tissue]
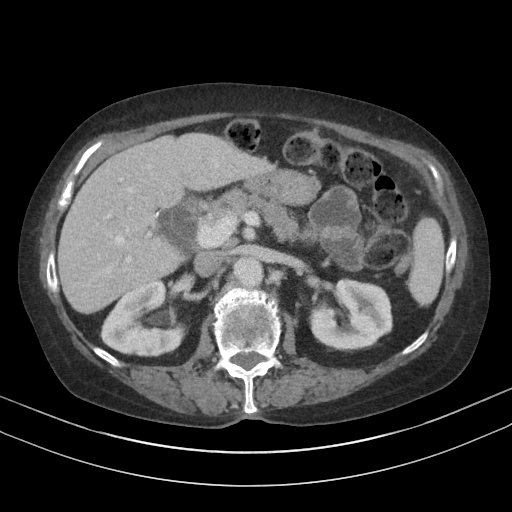
[im 132/152  soft-tissue]
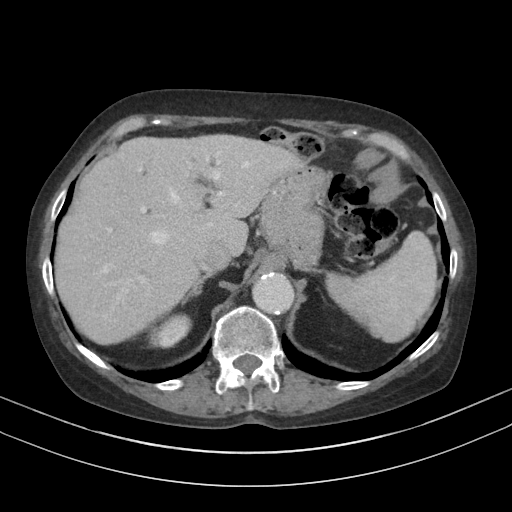
[im 142/152  soft-tissue]
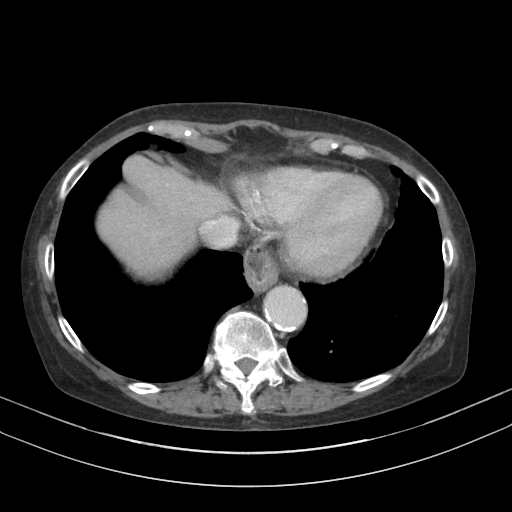

[Series 6: cor from thins · coronal · 0.70mm/px · 3 of 113 slices shown]
[im 38/113  soft-tissue]
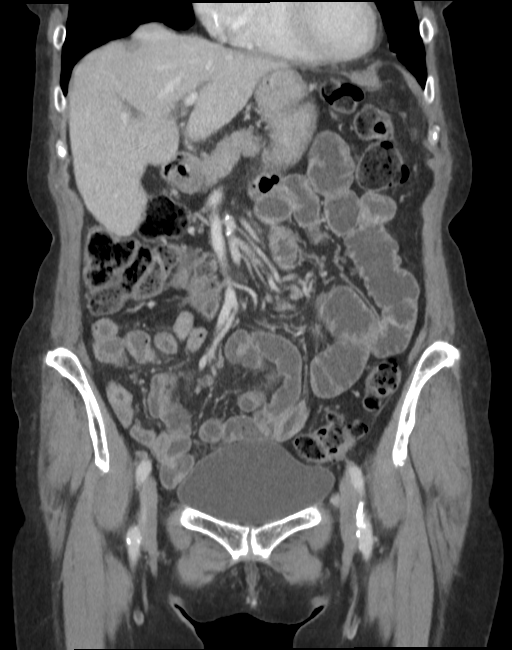
[im 50/113  soft-tissue]
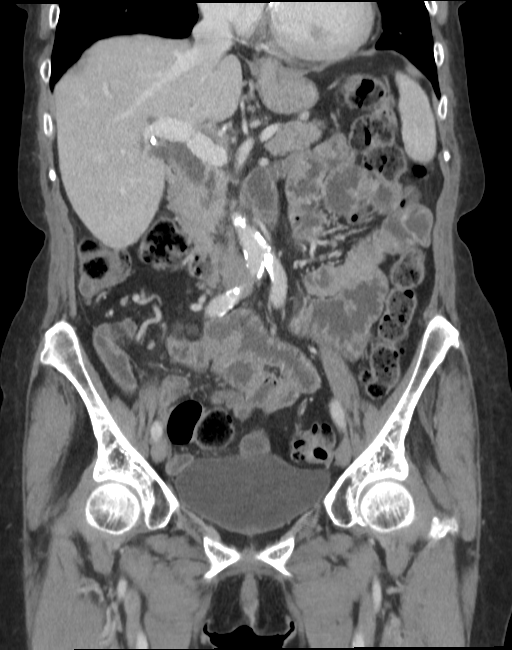
[im 63/113  soft-tissue]
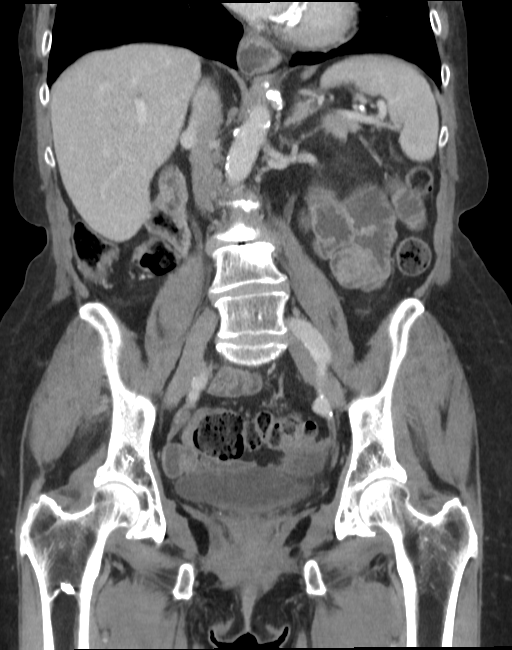

[15 of 46 positions shown; findings below may reference images not displayed]

FINDINGS: There is a small hiatal hernia. The stomach is otherwise unremarkable. There is 
evidence of fluid in the distal esophagus suggesting potential reflux. There is 
mild uncomplicated diverticulosis predominantly in the LEFT hemicolon. Colonic 
stool burden is mildly increased. The colon is otherwise unremarkable. There is 
mildly excessive fluid in the otherwise normal small bowel. No evidence for GI 
tract obstruction. The gallbladder is surgically absent. The extrahepatic 
biliary tract prominent with the common bile duct measuring 14 mm in dimension, 
unchanged from the prior exam. The distal pancreatic duct is also mildly 
prominent unchanged from the prior exam. The pancreas is otherwise unremarkable. 
The spleen and adrenal glands are within normal limits. Small RIGHT renal cysts 
are present. Kidneys are otherwise unremarkable. Urinary bladder is within 
normal limits. There is a thin-walled 6.2 x 4.8 cm LEFT adnexal cyst previously 
measuring 3.4 x 2.7 cm on June 07, 2015.. Right adnexal area is unremarkable. 
Uterus is normal in overall size for patient age. There are small fundal 
fibroids measuring up to 24 mm, stable. The abdominal aorta is normal in 
caliber. Mild to moderate scattered atherosclerotic calcifications are present. 
No abnormal abdominal or pelvic lymph nodes. No ascites. Anterior abdominal wall 
is intact. Degenerative changes incidentally noted in the spine which have 
increased since prior exam from 6685. Limited imaging of the inferior thorax 
shows partially visible coronary artery calcifications.
IMPRESSION: 1. Mildly excessive fluid is in the otherwise normal small bowel. Uncomplicated 
diverticulosis Colonic stool burden is mildly increased. Small hiatal hernia. 
Fluid in the distal esophagus suggesting potential reflux. GI tract otherwise 
unremarkable. 
2. Prominent extrahepatic biliary tract and distal pancreatic duct, stable since 
6685 and of doubtful significance unless there are elevated LFTs. 
3. There is a 6.2 x 4.8 cm thin-walled LEFT adnexal cyst which has shown 
enlargement from the previous measurement of 3.4 x 2.7 cm on June 07, 2015. 
Consider follow-up pelvic sonography for further characterization. 
4. Small fundal fibroids unchanged from the prior exam. 
RADIATION DOSE REDUCTION: All CT scans are performed using radiation dose 
reduction techniques, when applicable.  Technical factors are evaluated and 
adjusted to ensure appropriate moderation of exposure.  Automated dose 
management technology is applied to adjust the radiation doses to minimize 
exposure while achieving diagnostic quality images.

## 2023-01-13 IMAGING — MG MAMMOGRAPHY SCREENING BILATERAL 3[PERSON_NAME]
8 series · 8 of 24 positions shown · non-contrast
Comparison: Comparison was made to prior examinations.

________________________________________________________________________________________________ 
MAMMOGRAPHY SCREENING BILATERAL 3THEODORES MEI LAN, 01/13/2023 [DATE]: 
CLINICAL INDICATION: Encounter for screening mammogram.
TECHNIQUE: Digital bilateral mammograms and 3-D Tomosynthesis were obtained. 
These were interpreted both primarily and with the aid of computer-aided 
detection system.  
BREAST DENSITY: (Level C) The breasts are heterogeneously dense, which may 
obscure small masses.

[L CC]
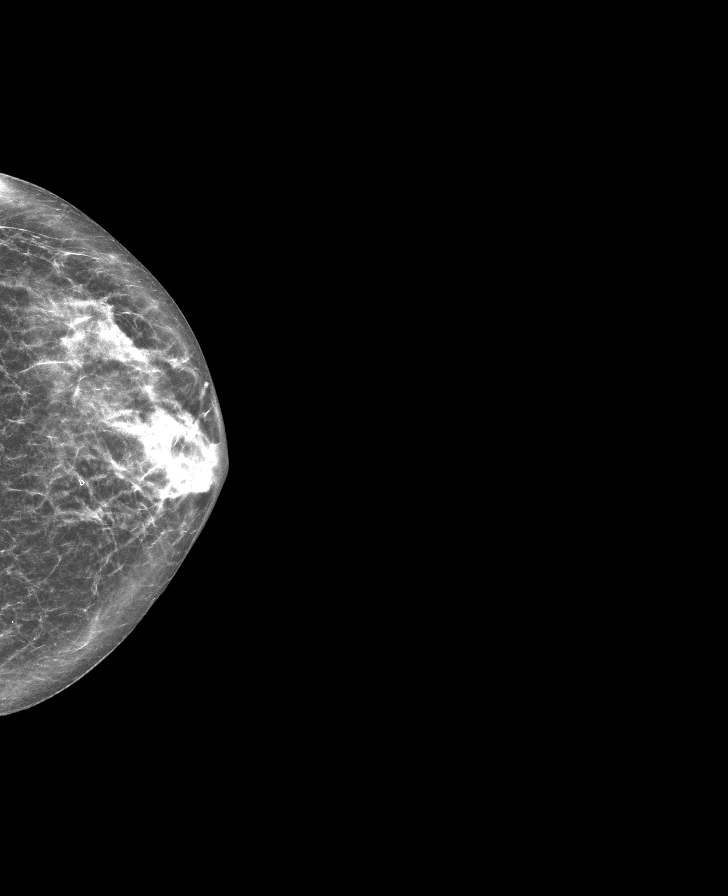

[R CC]
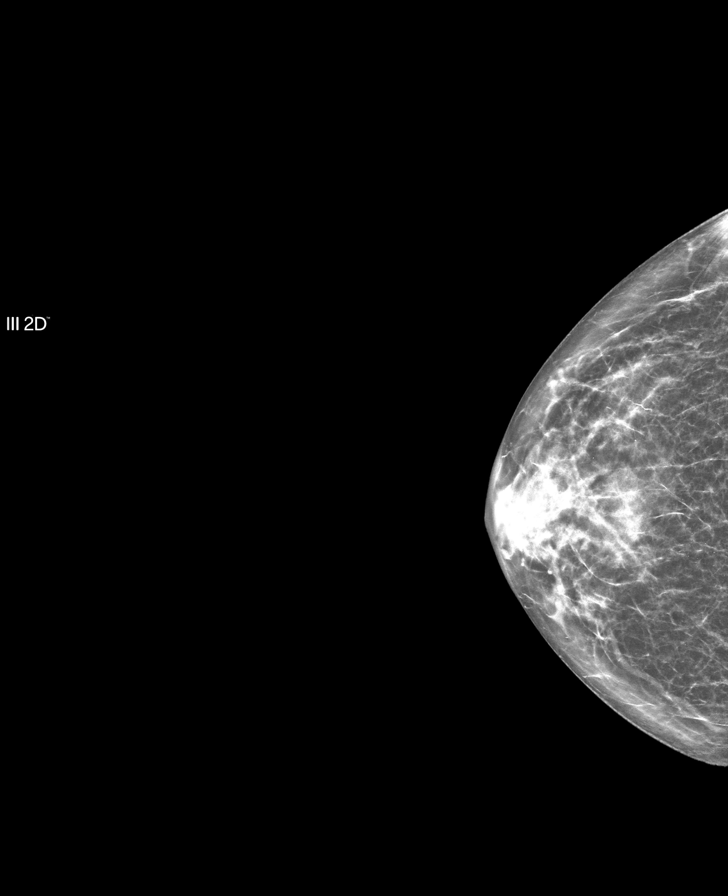

[R MLO]
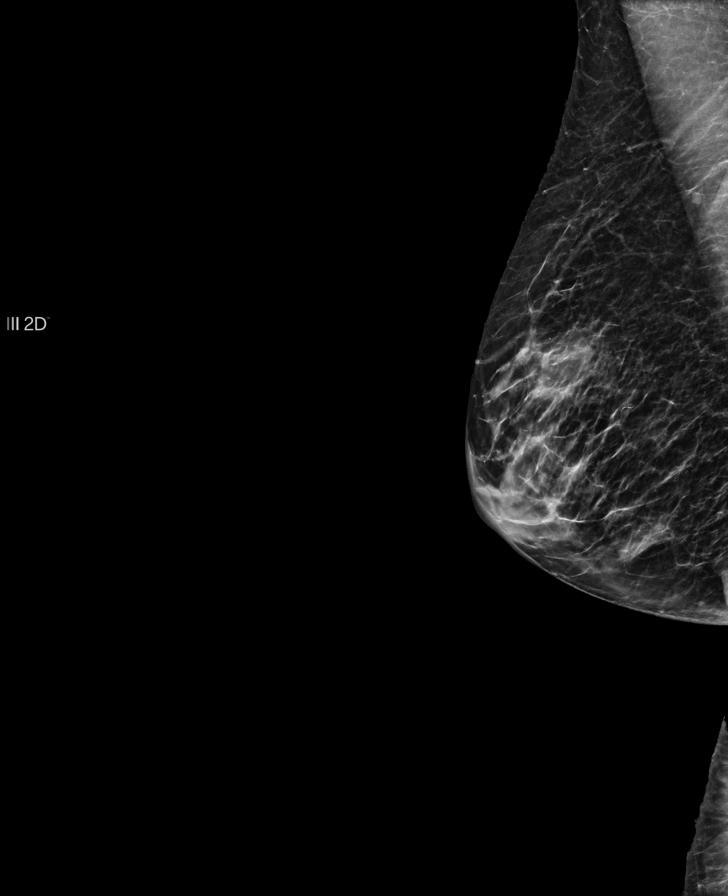

[L MLO]
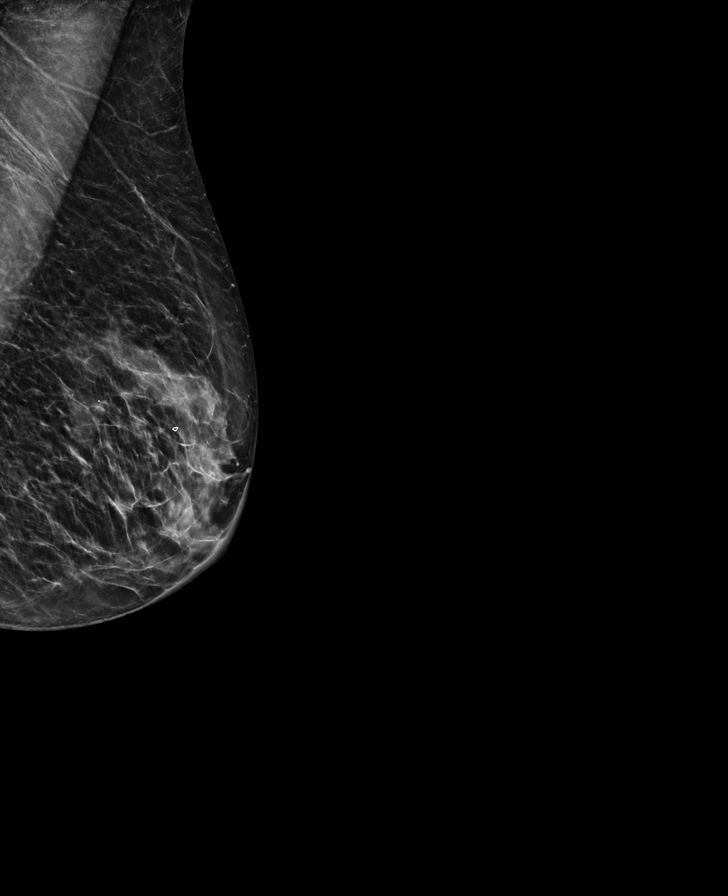

[R CC tomo · tomo slice 24/47.0]
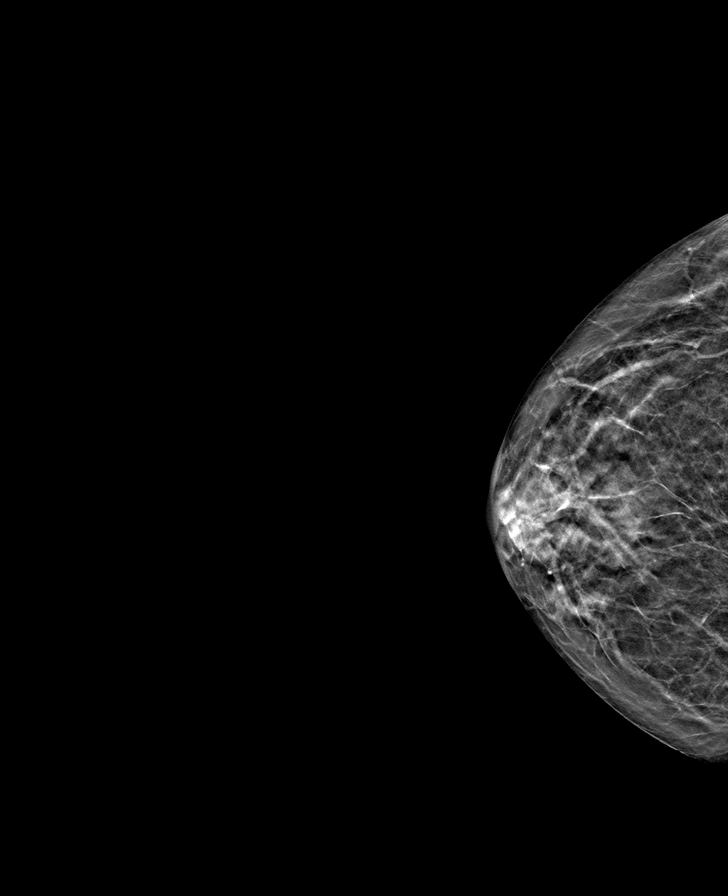

[L MLO tomo · tomo slice 27/53.0]
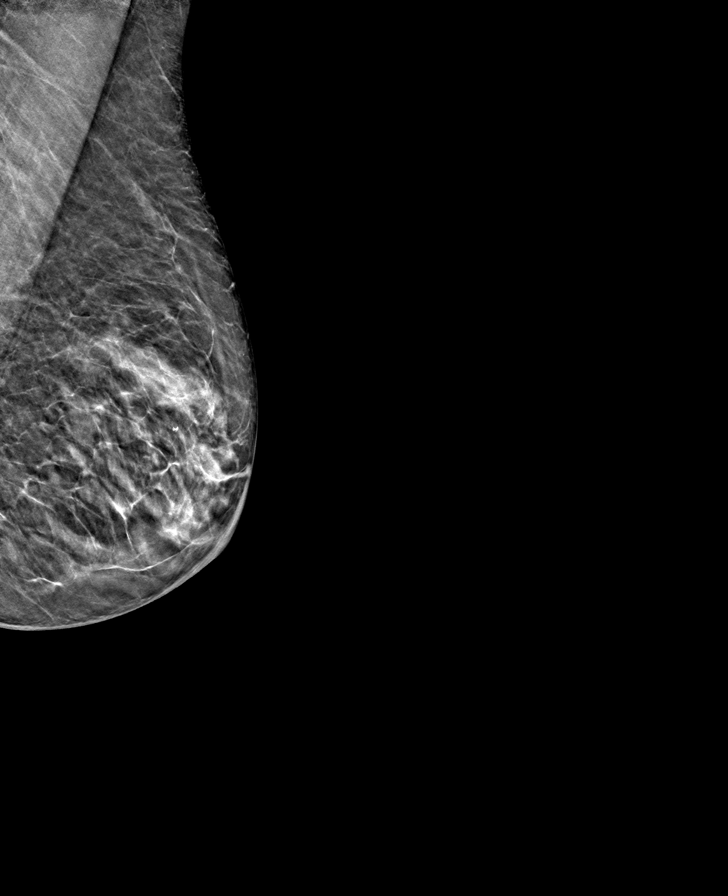

[L CC tomo · tomo slice 23/44.0]
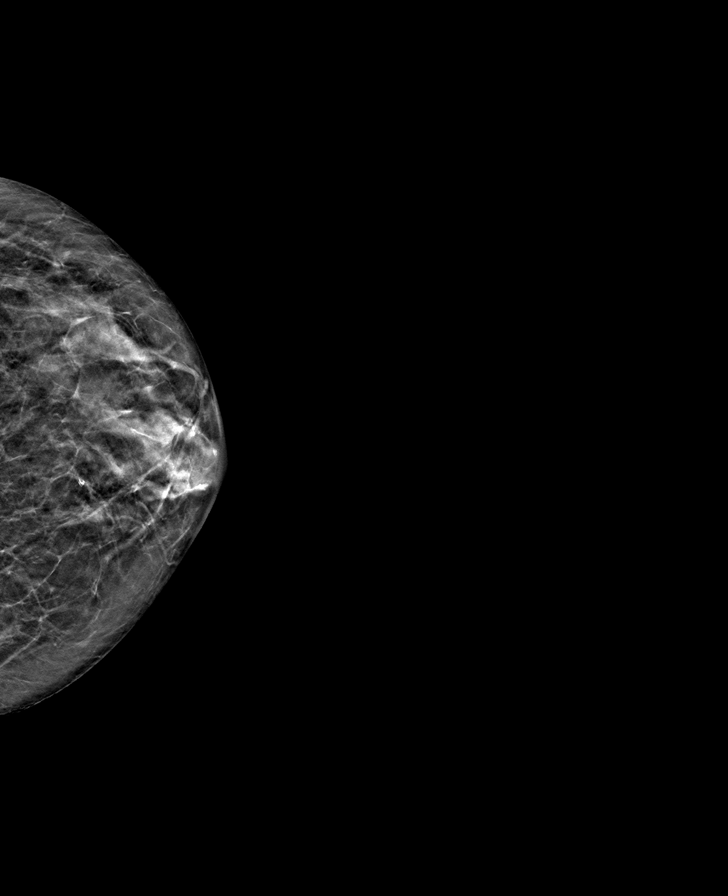

[R MLO tomo · tomo slice 25/48.0]
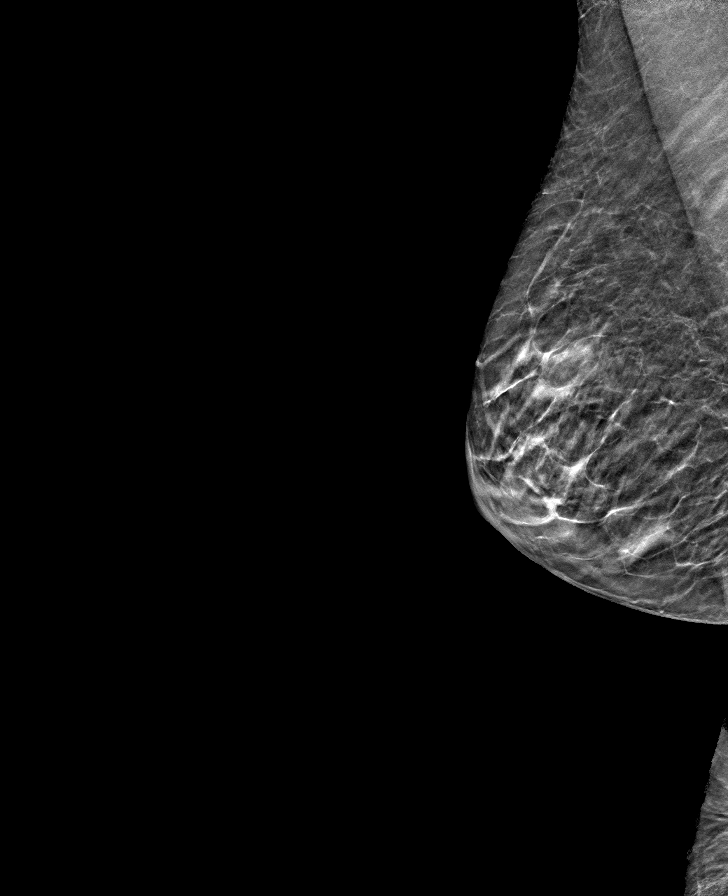

[8 of 24 positions shown; findings below may reference images not displayed]

FINDINGS: Biopsy clip left breast. Benign calcification. No suspicious mass, 
calcifications, or area of architectural distortion in either breast.
IMPRESSION: Stable mammogram. 
(BI-RADS 2) Benign findings. Routine mammographic follow-up is recommended.

## 2023-07-14 IMAGING — DX TIB-FIB LEFT 2 VIEWS
1 series · 2 of 2 positions shown · non-contrast
Comparison: None.

________________________________________________________________________________________________ 
TIB-FIB LEFT 2 VIEWS, 07/14/2023 [DATE]: 
CLINICAL INDICATION: Trauma to lower leg in July. Nonhealing wound..

[Series 1: AP · U · 0.14mm/px · 2 of 2 slices shown]
[im 1/2]
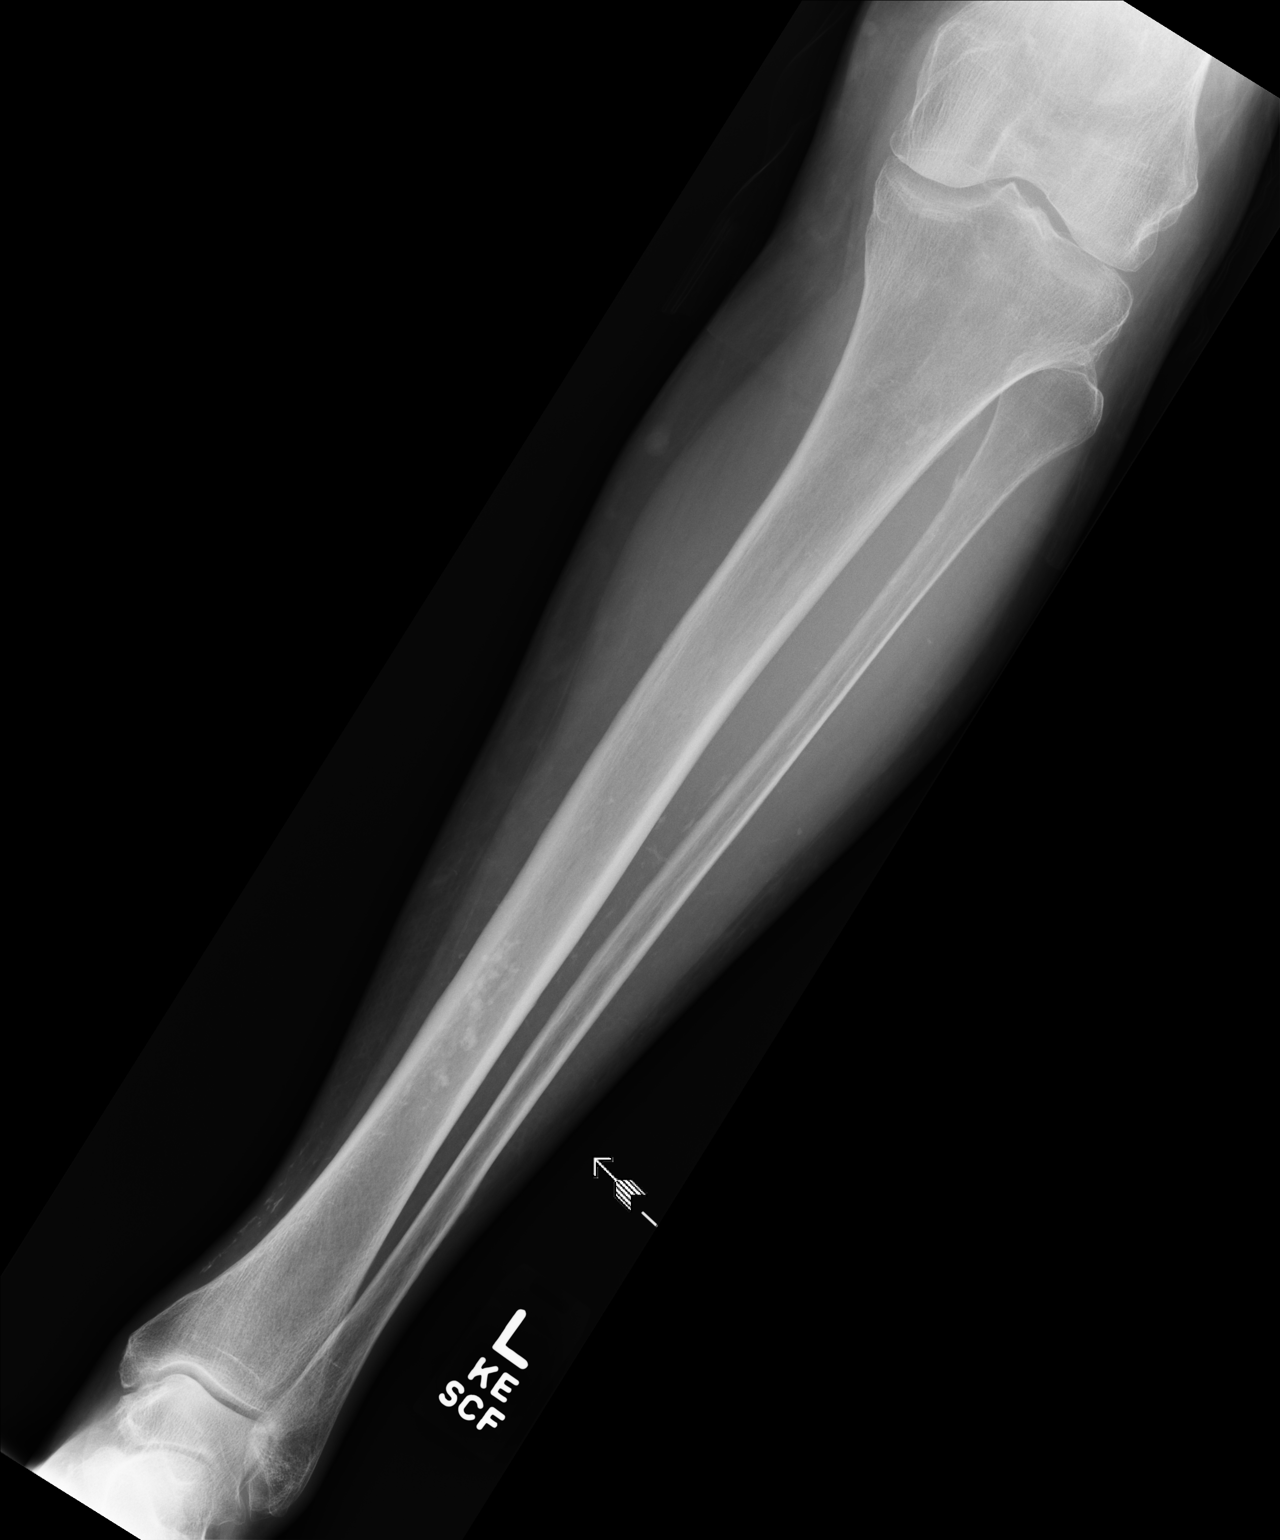
[im 2/2]
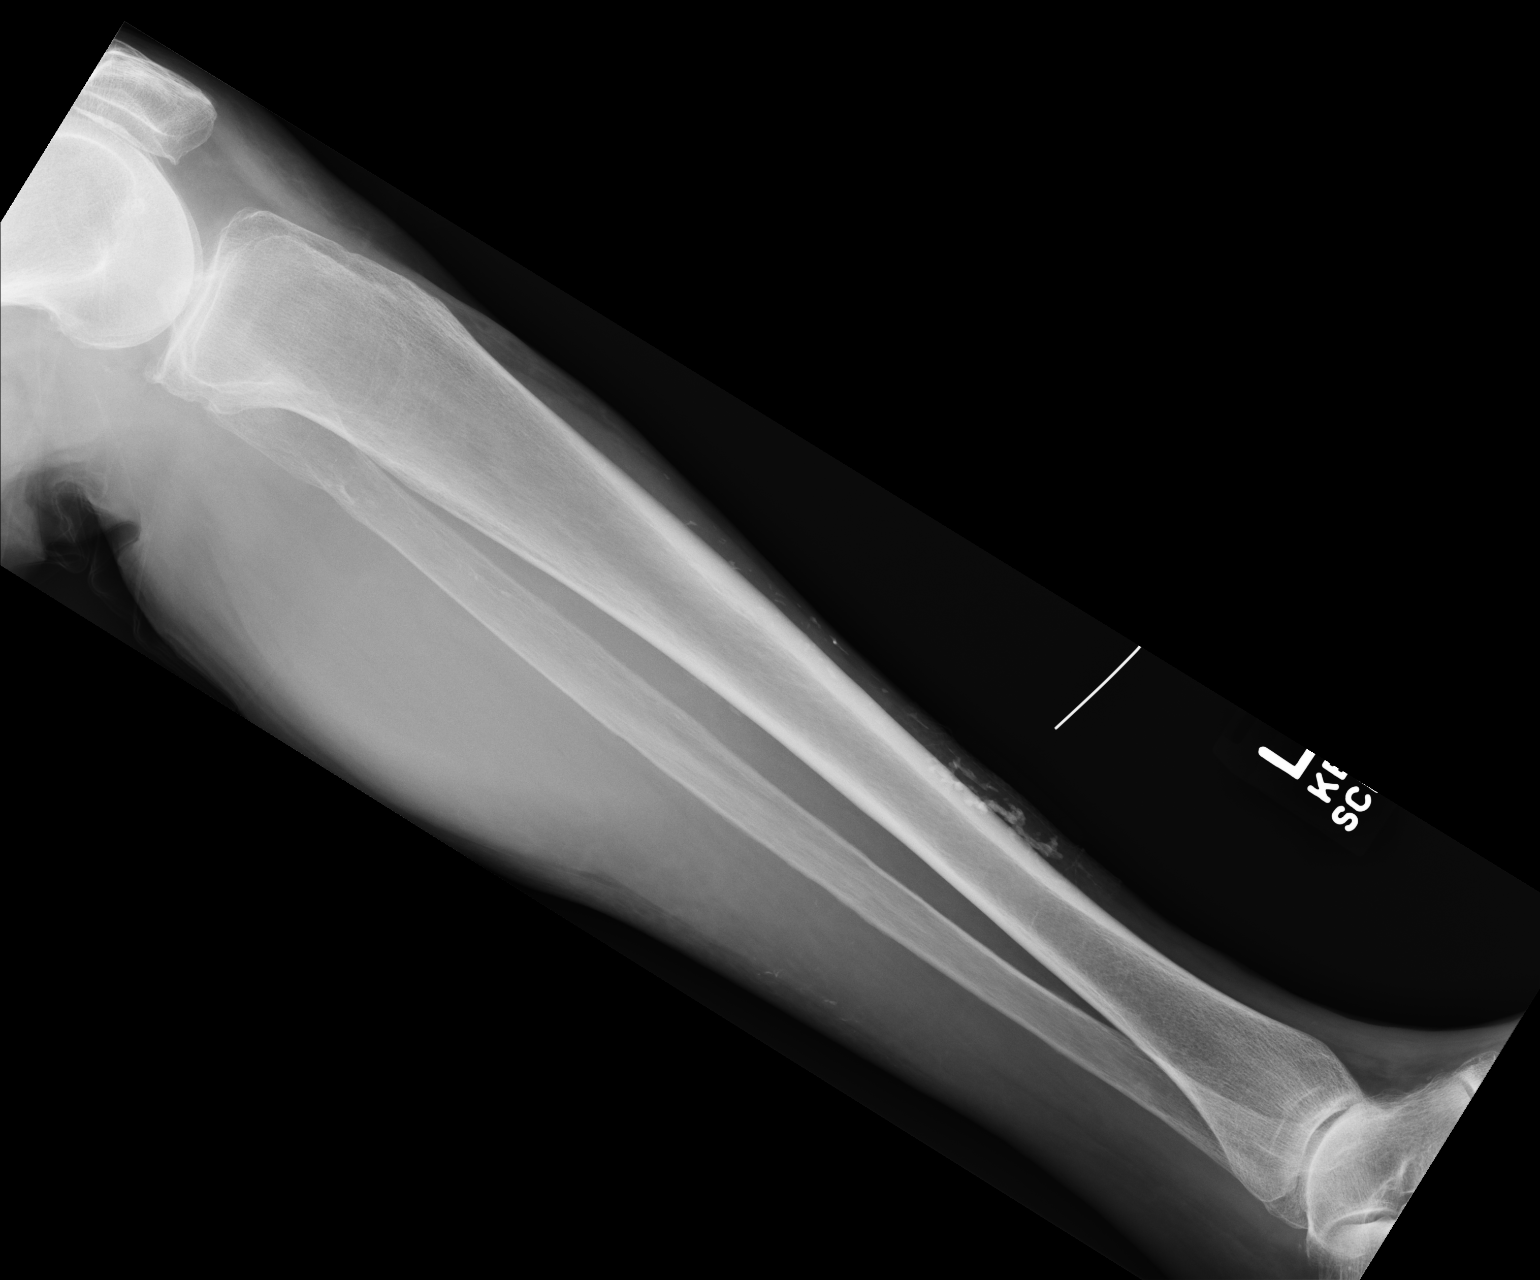

[2 of 2 positions shown; findings below may reference images not displayed]

FINDINGS: There are dystrophic calcifications within the lower pretibial soft 
tissues at site of well demarcated by marker. No erosion, periostitis or 
cortical loss to suggest osteomyelitis. No fracture. Normal alignment.
IMPRESSION: No radiographic findings to suggest acute osteomyelitis.

## 2023-07-18 IMAGING — MR MRI LEFT TIB-FIB WITHOUT CONTRAST
5 of 7 series · 23 of 40 positions shown · IV contrast (gadolinium)
Comparison: 07/14/2023 radiographs

________________________________________________________________________________________________ 
MRI LEFT TIB-FIB WITHOUT CONTRAST, 07/18/2023 [DATE]: 
CLINICAL INDICATION: Atherosclerosis Of Native Arteries Of Left Leg With 
Ulceration. Nonhealing pretibial wound.
TECHNIQUE: Multiplanar, multiecho position MR images of the left calf were 
performed without intravenous gadolinium enhancement. The right calf was 
included on coronal imaging. Patient was scanned on a 3T magnet.

[Series 101: survey · axial · 15.0mm · 1.76mm/px · 1 of 11 slices shown]
[im 1/11]
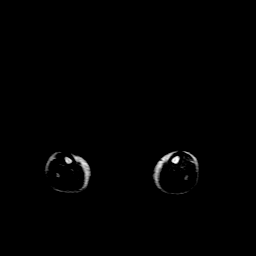

[Series 301: stir_cor · coronal · 5.0mm · 0.83mm/px · 3 of 24 slices shown]
[im 1/24]
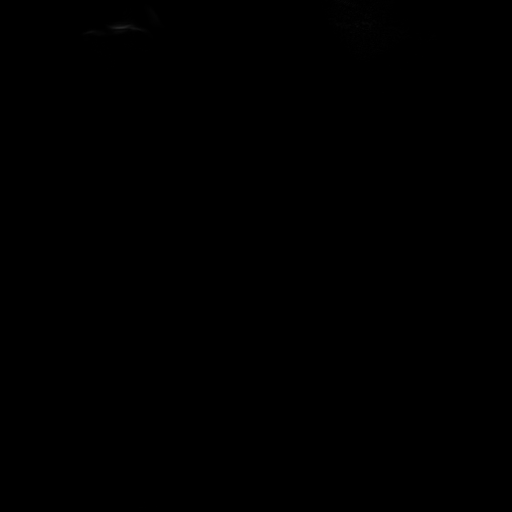
[im 12/24]
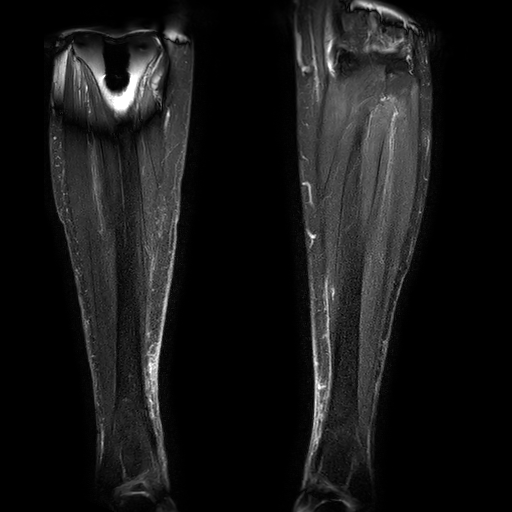
[im 24/24]
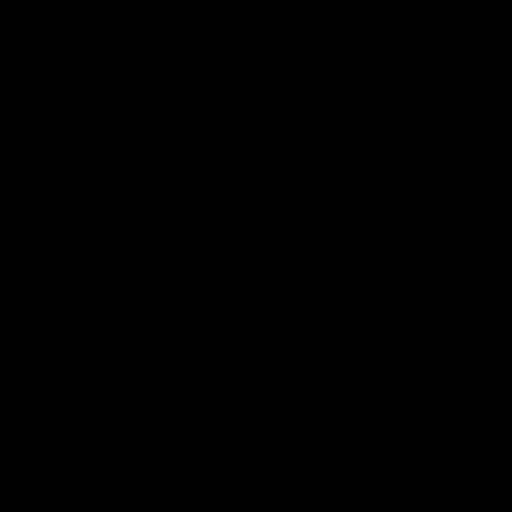

[Series 401: t1w_tse · coronal · 5.0mm · 0.76mm/px · 4 of 24 slices shown]
[im 1/24]
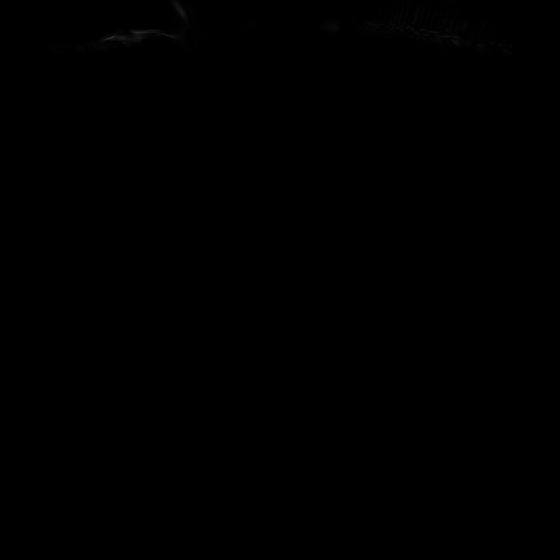
[im 8/24]
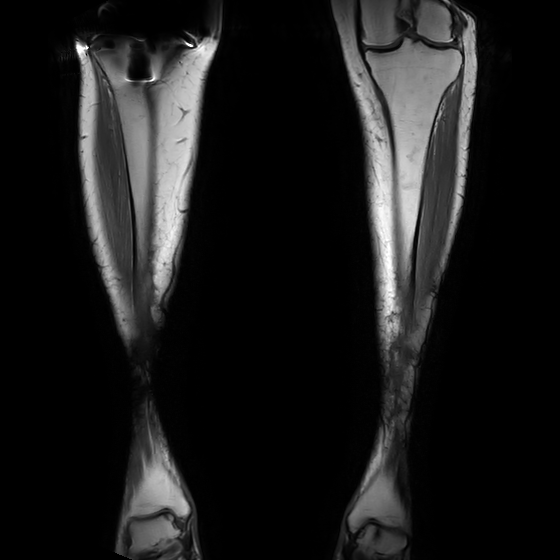
[im 16/24]
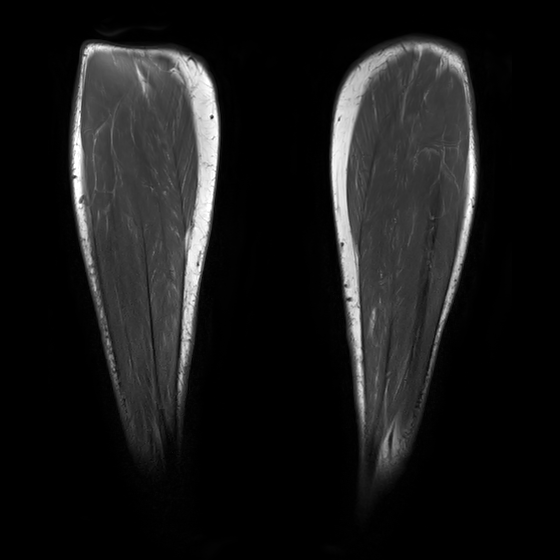
[im 24/24]
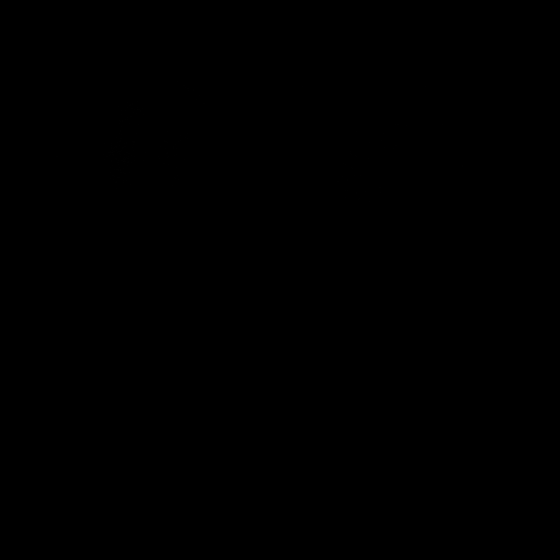

[Series 501: t1w ax · axial · 4.8mm · 0.52mm/px · z∈[-205,-42]mm · 4 of 72 slices shown]
[im 1/72]
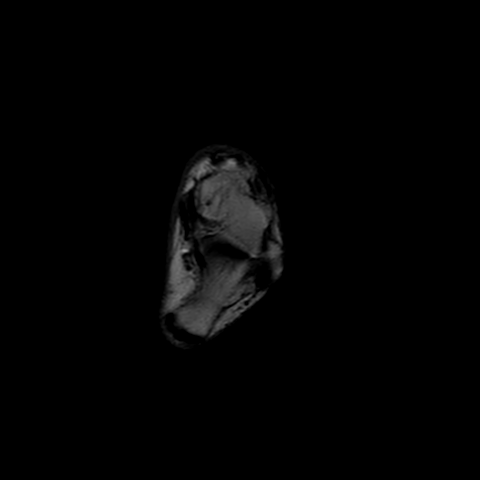
[im 15/72]
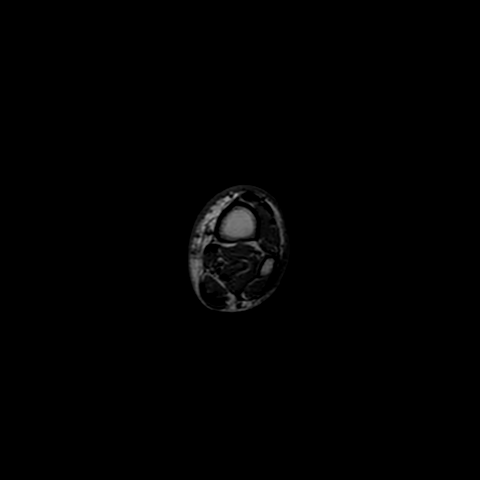
[im 22/72]
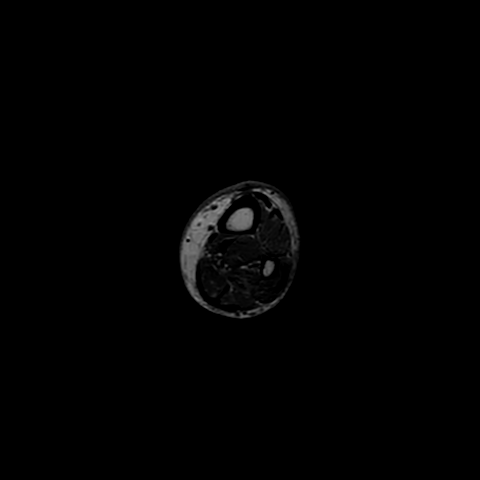
[im 29/72]
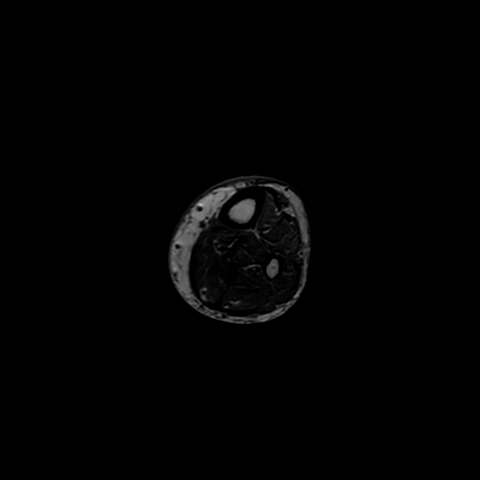

[Series 601: T2 fat-sat · axial · 4.8mm · 0.62mm/px · z∈[-205,+207]mm · 11 of 72 slices shown]
[im 1/72]
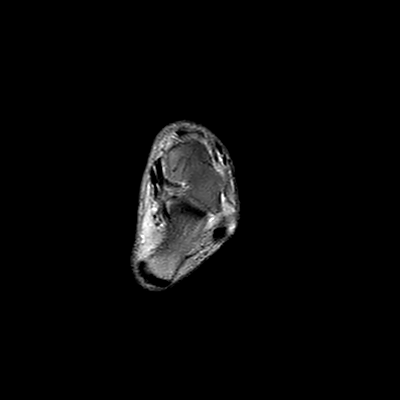
[im 8/72]
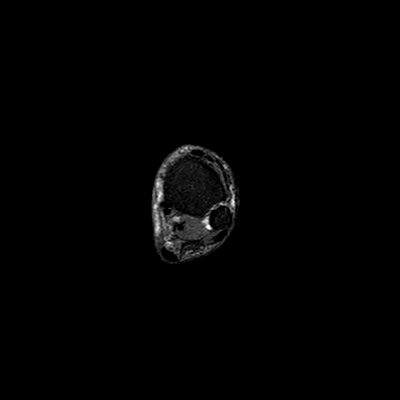
[im 15/72]
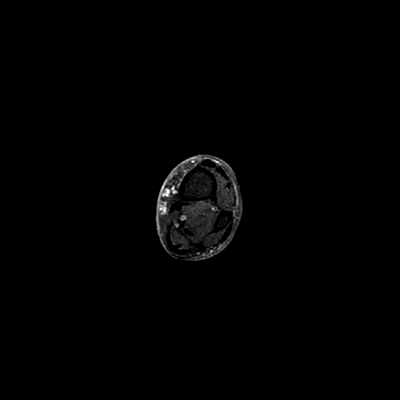
[im 22/72]
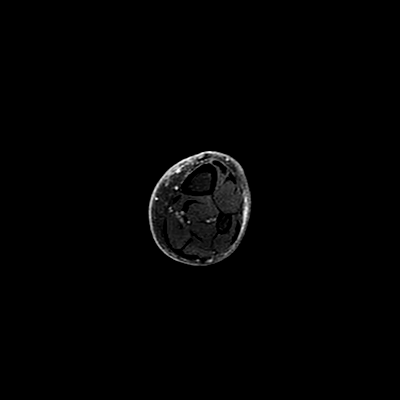
[im 29/72]
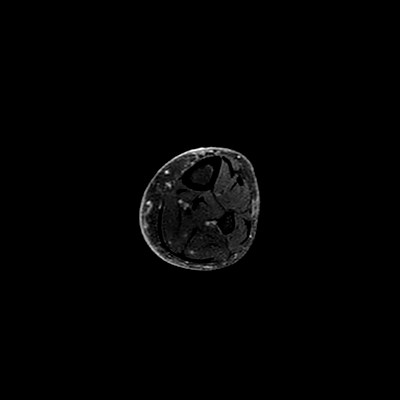
[im 36/72]
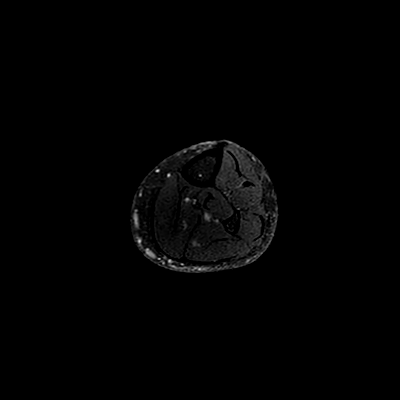
[im 43/72]
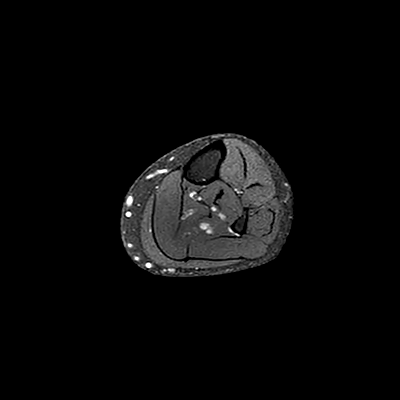
[im 50/72]
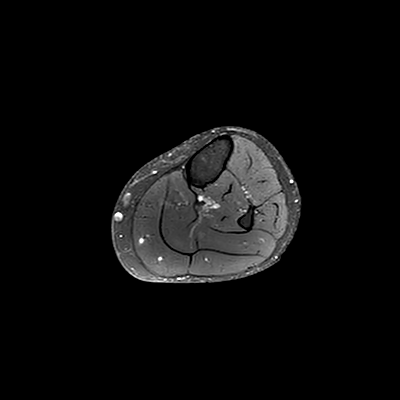
[im 57/72]
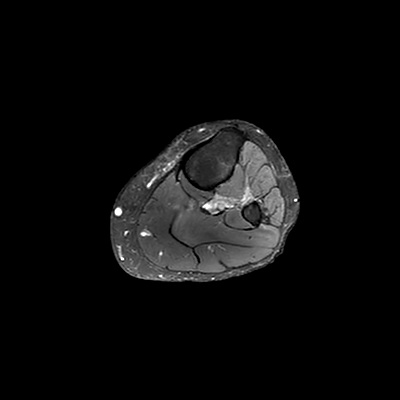
[im 64/72]
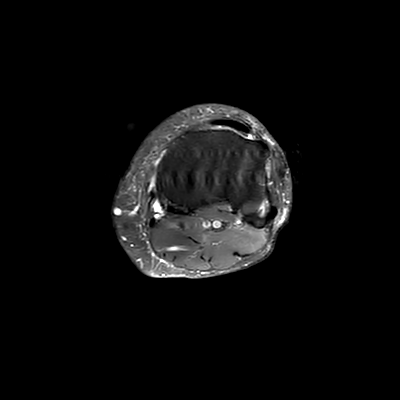
[im 72/72]
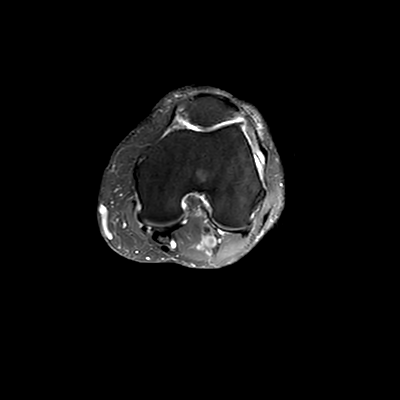

[23 of 40 positions shown; findings below may reference images not displayed]

FINDINGS: TIBIA/FIBULA: No osteomyelitis, fracture or marrow replacing lesion.  
JOINTS: Full-thickness tear of the posterior horn of the left medial meniscus 
and degenerative change of the left knee. Small left medial talar dome 
osteochondral lesion. No focal erosion. Normal alignment. No joint effusion. No 
discrete synovitis.  
TENDONS: Intact. 
SOFT TISSUES: Subcentimeter mid-pretibial skin ulceration is located 11.5 cm 
proximal to the tibial plafond. Adjacent mild skin thickening and subcutaneous 
soft tissue swelling. No abscess. Normal neurovascular bundles. Normal muscle 
signal intensity, without atrophy. No mass or fluid collections. Normal 
subcutaneous soft tissues. 
OTHER: Partially imaged right total knee arthroplasty (TKA). Small right medial 
talar dome osteochondral lesion.
IMPRESSION: 1.  No abscess or osteomyelitis. 
2.  Subcentimeter mid-pretibial skin with adjacent mild skin thickening and 
subcutaneous soft tissue swelling. 
3.  Full-thickness tear of the posterior horn of the left medial meniscus and 
degenerative change of the left knee.  
4.  Small left medial talar dome osteochondral lesion.

## 2023-12-05 IMAGING — MR MRI LEFT TIB-FIB WITHOUT CONTRAST
5 of 6 series · 20 of 40 positions shown · IV contrast (gadolinium)
Comparison: 07/18/2023 MRI

________________________________________________________________________________________________ 
MRI LEFT TIB-FIB WITHOUT CONTRAST, 12/05/2023 [DATE]: 
CLINICAL INDICATION: Non-pressure Chronic Ulcer Of Other Part Of Left Lower Leg 
With Necrosis Of Bone. Other Chronic Osteomyelitis, Left Tibia And Fibula
TECHNIQUE: Multiplanar, multiecho position MR images of the left calf were 
performed without intravenous gadolinium enhancement. The right calf was 
included on coronal imaging. Patient was scanned on a 3T magnet.

[Series 201: survey · axial · 10.0mm · 1.95mm/px · z∈[-19,+280]mm · 2 of 12 slices shown]
[im 1/12]
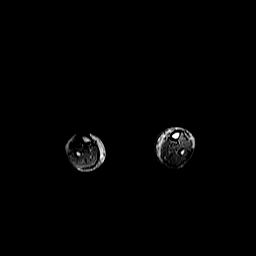
[im 12/12]
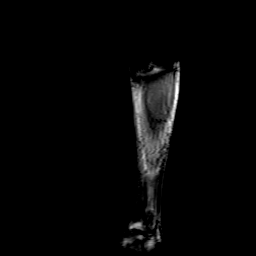

[Series 301: t1w_tse(bilat) · coronal · 4.0mm · 0.76mm/px · 4 of 24 slices shown]
[im 1/24]
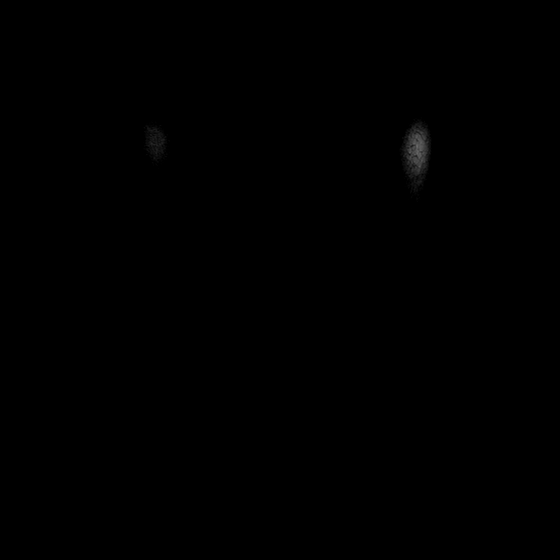
[im 8/24]
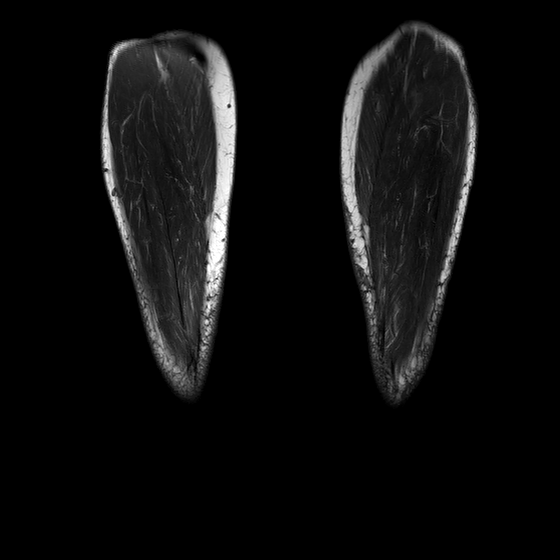
[im 16/24]
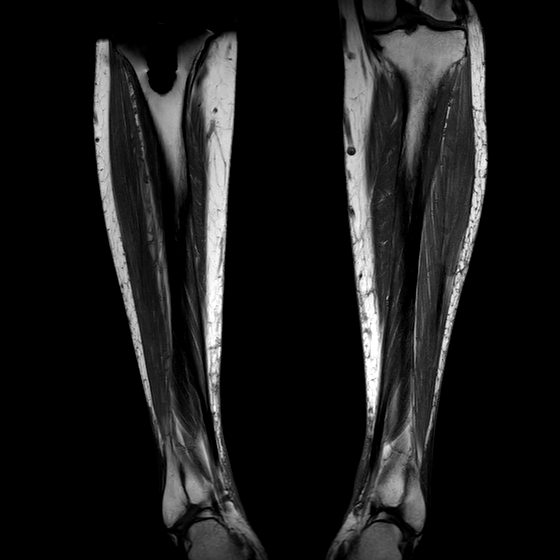
[im 24/24]
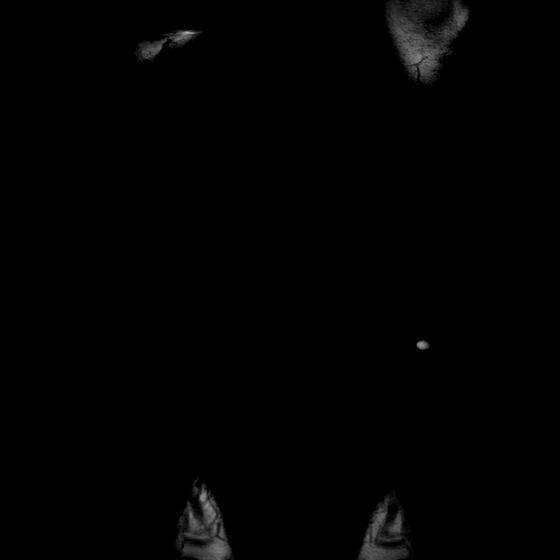

[Series 401: stir_longte(bilat) · coronal · 4.0mm · 0.83mm/px · 4 of 22 slices shown]
[im 1/22]
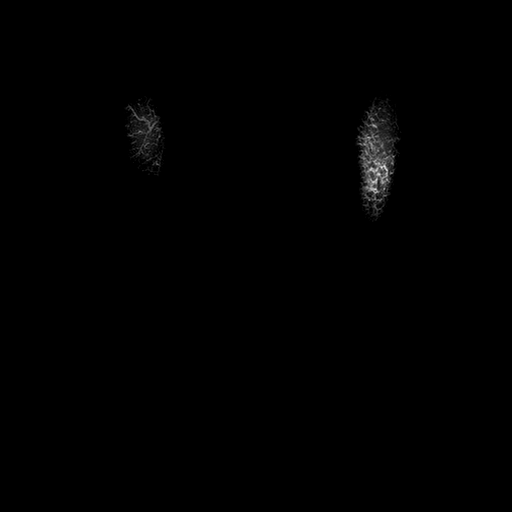
[im 8/22]
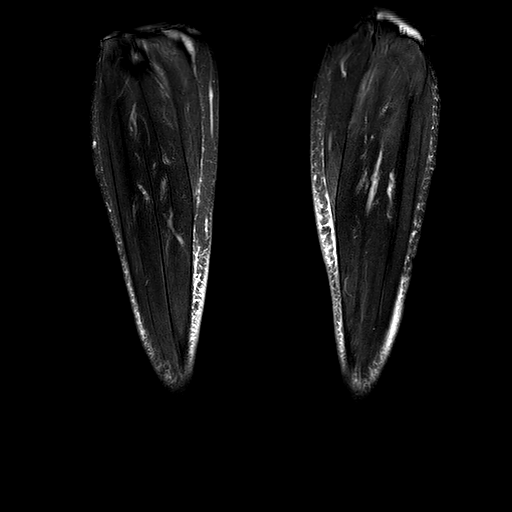
[im 15/22]
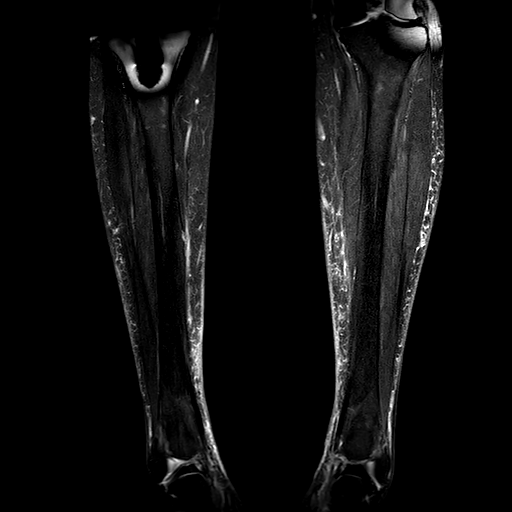
[im 22/22]
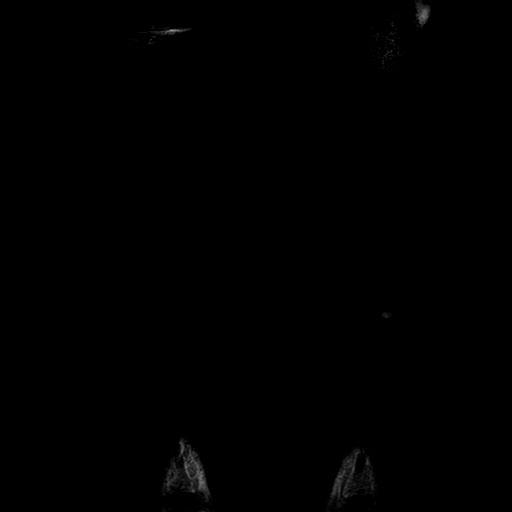

[Series 501: stir_longte · sagittal · 4.0mm · 0.80mm/px · 1 of 25 slices shown]
[im 1/25]
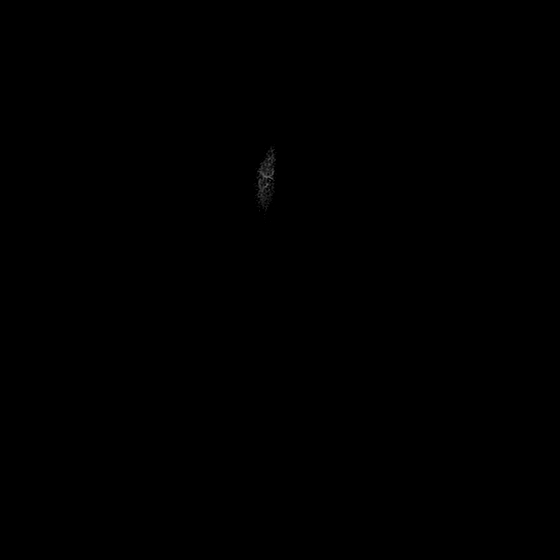

[Series 701: T2 fat-sat · axial · 5.0mm · 0.33mm/px · z∈[-224,+259]mm · 9 of 70 slices shown]
[im 1/70]
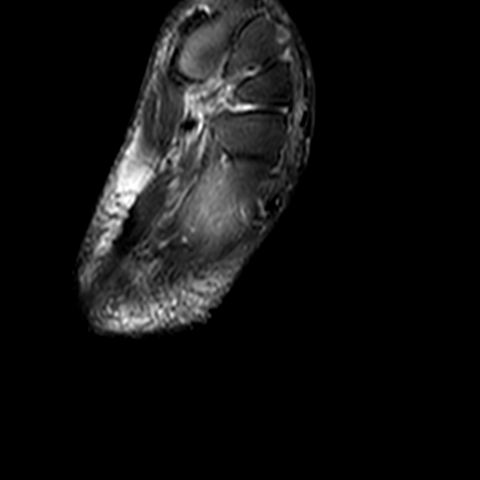
[im 12/70]
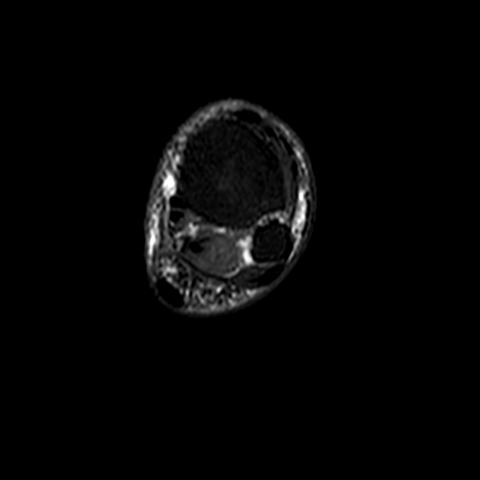
[im 24/70]
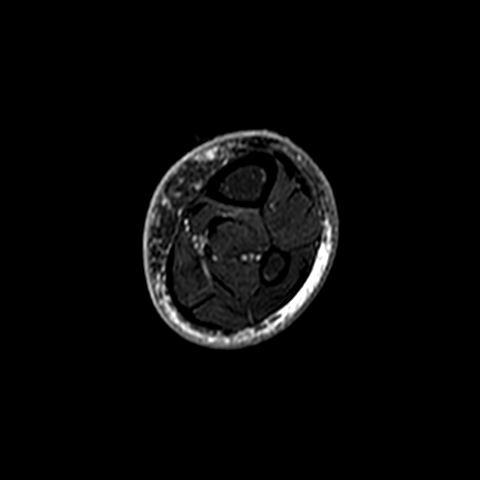
[im 29/70]
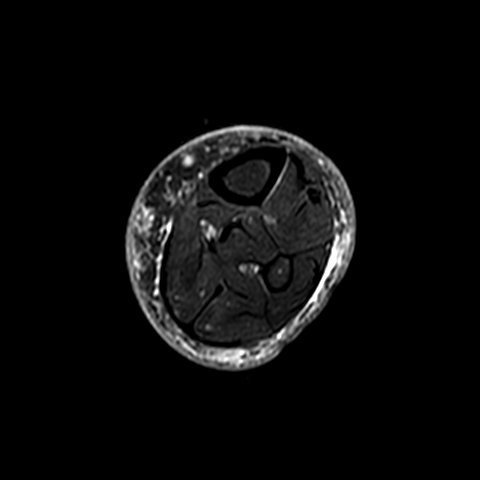
[im 35/70]
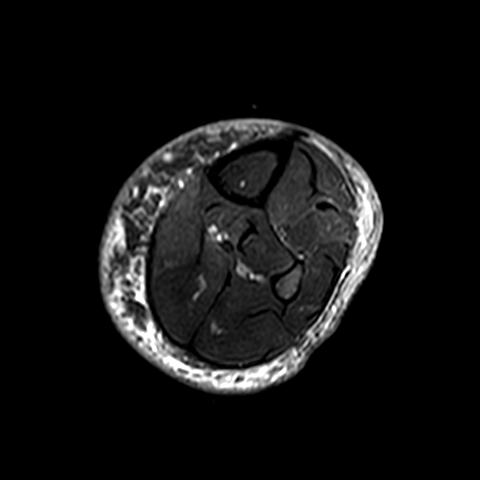
[im 41/70]
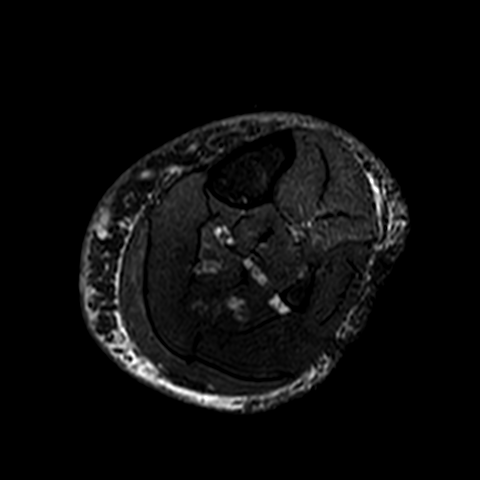
[im 47/70]
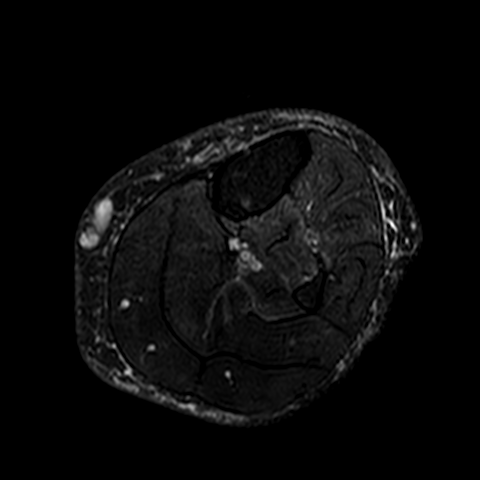
[im 58/70]
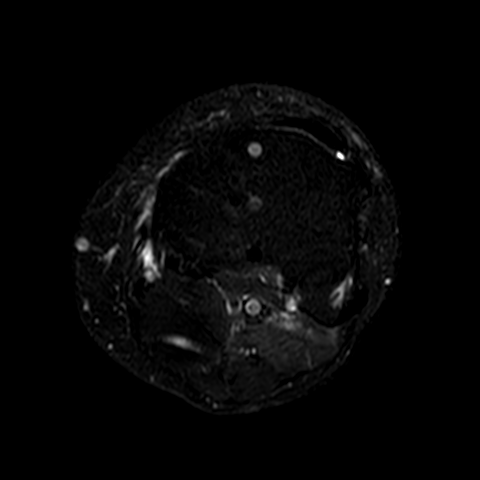
[im 70/70]
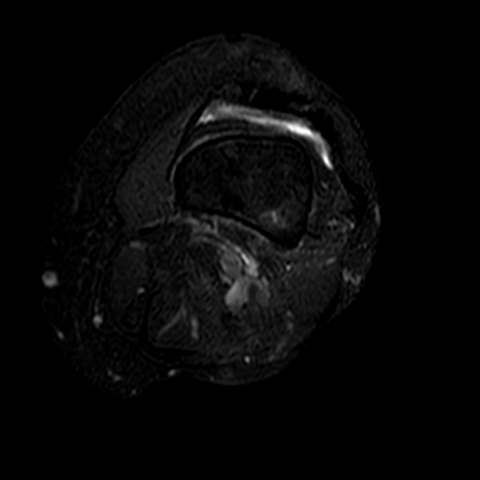

[20 of 40 positions shown; findings below may reference images not displayed]

FINDINGS: TIBIA/FIBULA: No osteomyelitis, fracture or marrow replacing lesion.  
JOINTS: Tear of the posterior horn of the left medial meniscus and degenerative 
change of the left knee. Mild degenerative change of the left ankle. No focal 
erosion. Normal alignment. No joint effusion. No discrete synovitis.  
TENDONS: Intact. 
SOFT TISSUES: Chronic subcentimeter mid-pretibial superficial skin ulceration 
with adjacent skin thickening (up to 0.3 cm) and subcutaneous soft tissue 
swelling. No abscess. Normal neurovascular bundles. Normal muscle signal 
intensity, without atrophy. No mass or fluid collections. Normal subcutaneous 
soft tissues. 
OTHER: Partially imaged right total knee arthroplasty (TKA). Small right medial 
talar dome osteochondral lesion.
IMPRESSION: 1.  No abscess or osteomyelitis. 
2.  Chronic subcentimeter mid-pretibial superficial skin ulceration with 
adjacent soft tissue swelling. 
3.  Stable additional findings.

## 2024-02-29 IMAGING — MG MAMMOGRAPHY SCREENING BILATERAL 3[PERSON_NAME]
8 series · 9 of 24 positions shown · non-contrast
Comparison: Comparison was made to prior examinations.

________________________________________________________________________________________________ 
MAMMOGRAPHY SCREENING BILATERAL 3VISHWA PETRUS, 02/29/2024 [DATE]: 
CLINICAL INDICATION: Encounter for screening mammogram.
TECHNIQUE: Digital bilateral mammograms and 3-D Tomosynthesis were obtained. 
These were interpreted both primarily and with the aid of computer-aided 
detection system.  
BREAST DENSITY: (Level C) The breasts are heterogeneously dense, which may 
obscure small masses.

[L CC]
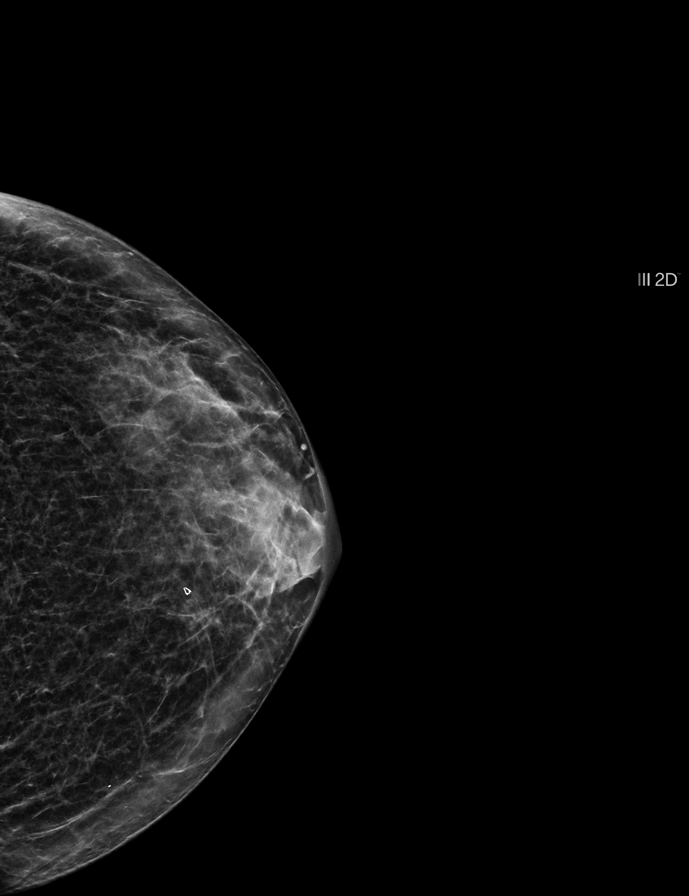

[R CC]
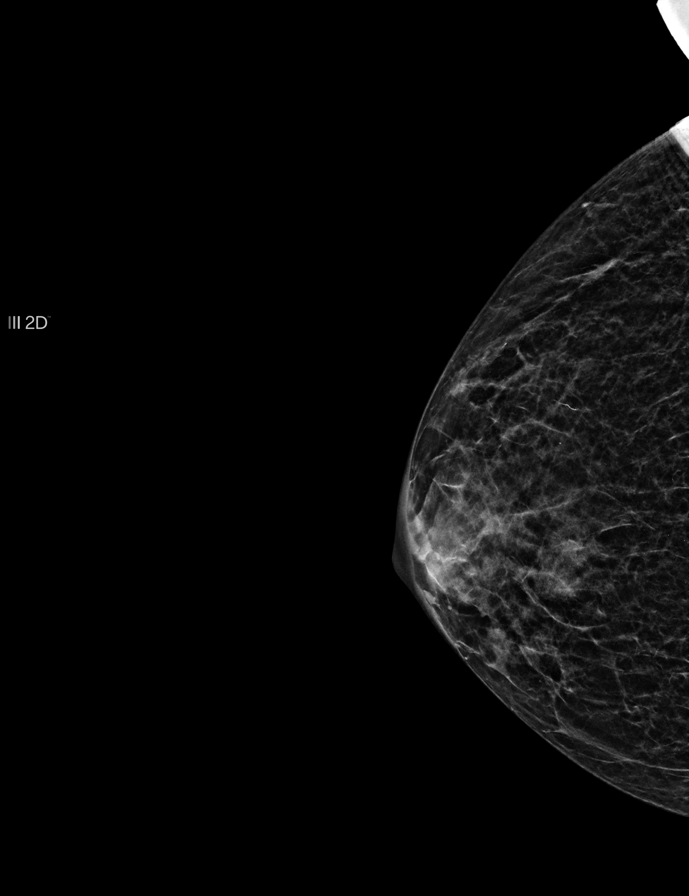

[L MLO]
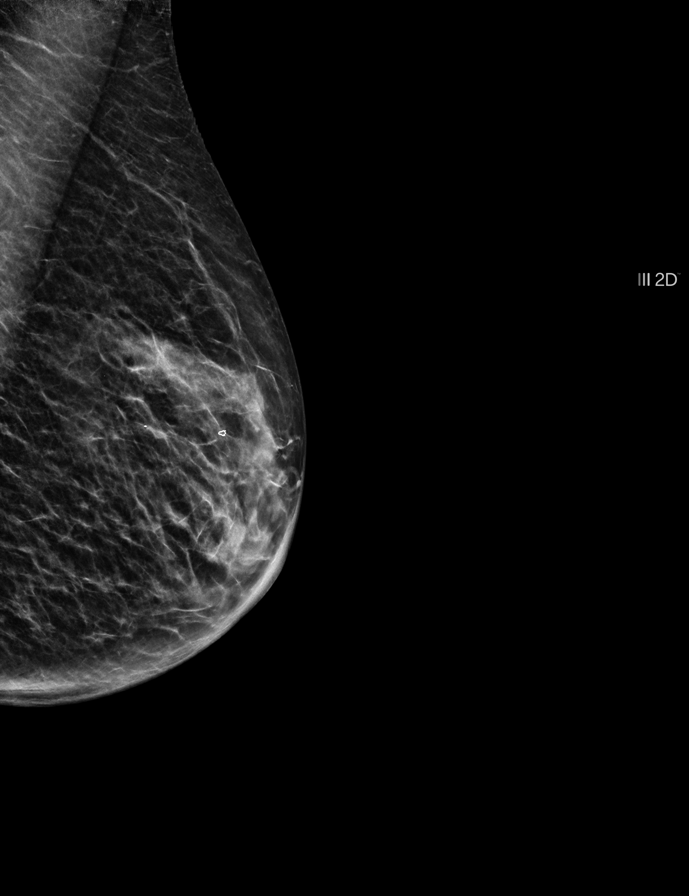

[R MLO]
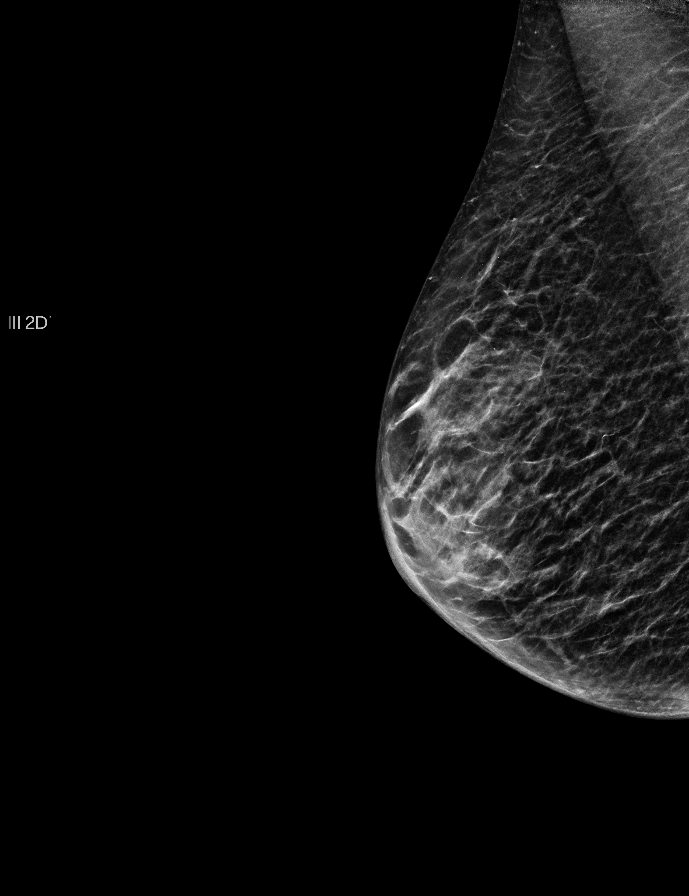

[L MLO tomo · 2 of 14 frames shown]
[frame 5/14]
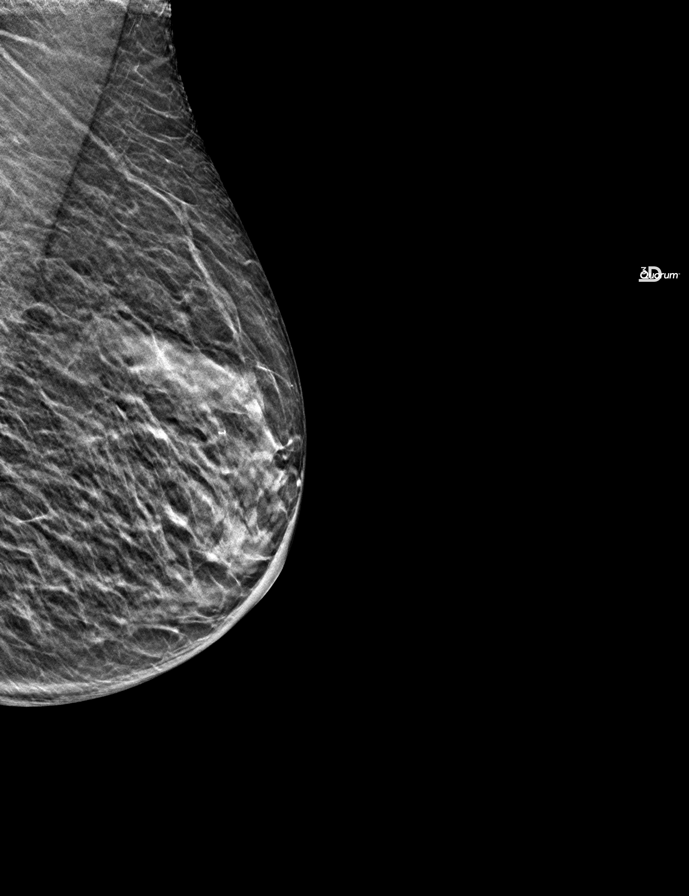
[frame 7/14]
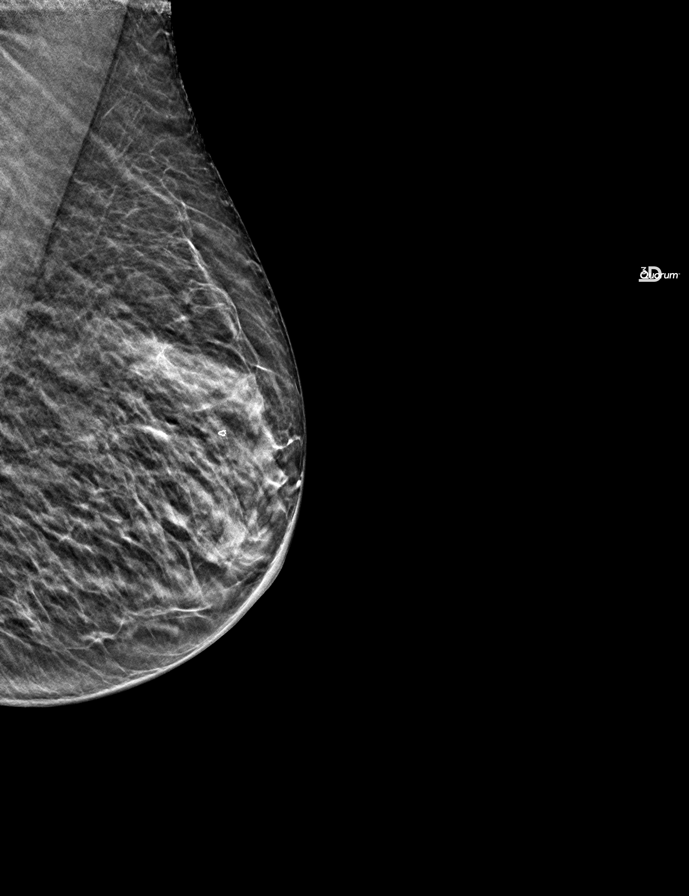

[R MLO tomo · tomo slice 7/12.0]
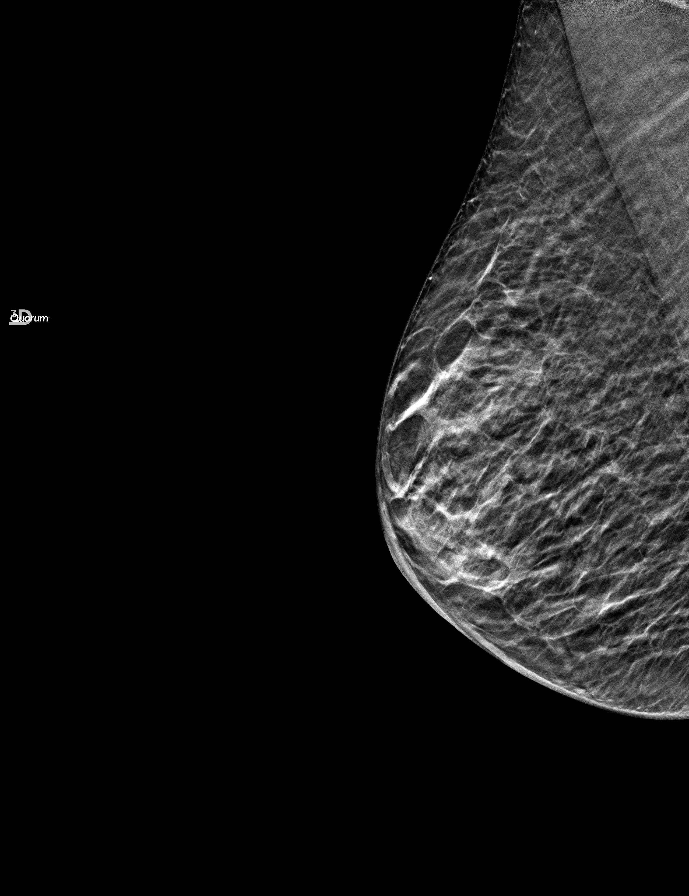

[R CC tomo · tomo slice 6/11.0]
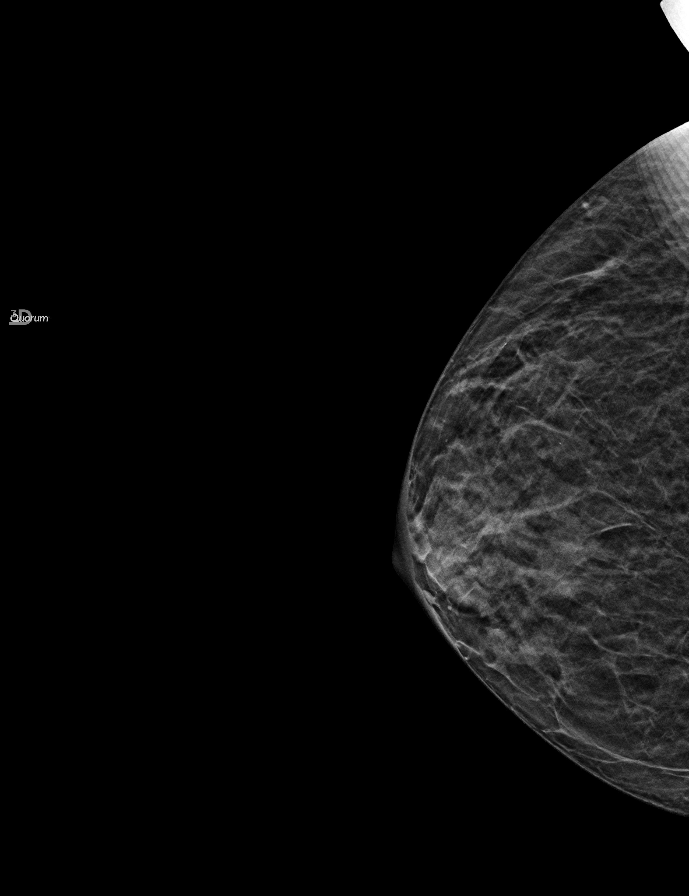

[L CC tomo · tomo slice 7/13.0]
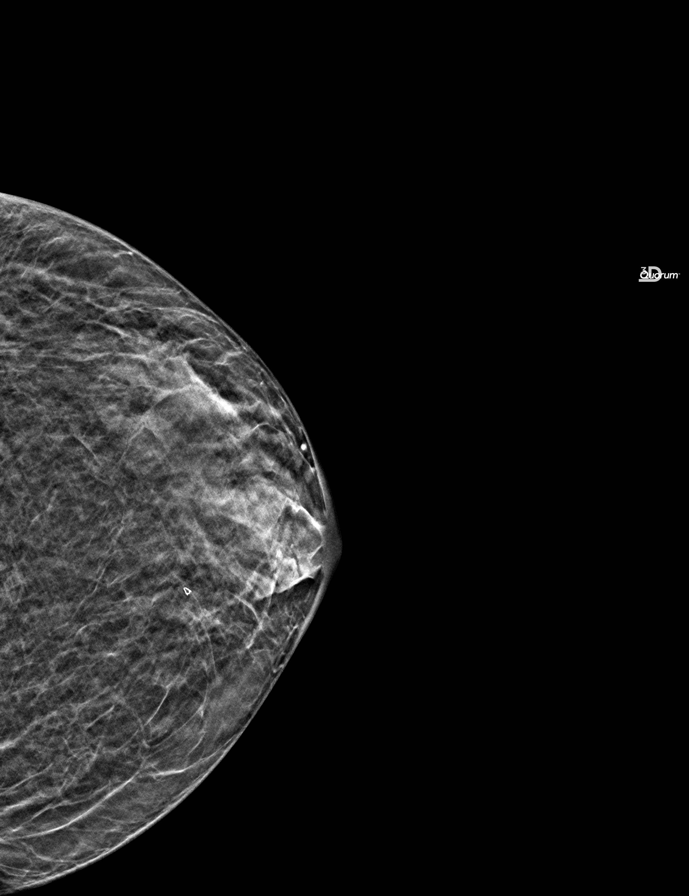

[9 of 24 positions shown; findings below may reference images not displayed]

FINDINGS: Benign calcification. Biopsy clip left breast. No suspicious mass, 
calcifications, or area of architectural distortion in either breast.
IMPRESSION: Stable mammogram. 
(BI-RADS 2) Benign findings. Routine mammographic follow-up is recommended.

## 2024-04-11 ENCOUNTER — Encounter (INDEPENDENT_AMBULATORY_CARE_PROVIDER_SITE_OTHER): Payer: Self-pay
